# Patient Record
Sex: Male | Born: 1960 | Race: White | Hispanic: No | Marital: Married | Smoking: Never smoker
Health system: Southern US, Community
[De-identification: ages and names within clinical notes are randomized; demographics above are authoritative.]

## PROBLEM LIST (undated history)

## (undated) DIAGNOSIS — E785 Hyperlipidemia, unspecified: Secondary | ICD-10-CM

## (undated) DIAGNOSIS — I10 Essential (primary) hypertension: Secondary | ICD-10-CM

## (undated) DIAGNOSIS — C801 Malignant (primary) neoplasm, unspecified: Secondary | ICD-10-CM

## (undated) DIAGNOSIS — M1990 Unspecified osteoarthritis, unspecified site: Secondary | ICD-10-CM

## (undated) HISTORY — DX: Essential (primary) hypertension: I10

## (undated) HISTORY — DX: Hyperlipidemia, unspecified: E78.5

## (undated) HISTORY — PX: WISDOM TOOTH EXTRACTION: SHX21

## (undated) HISTORY — DX: Unspecified osteoarthritis, unspecified site: M19.90

## (undated) HISTORY — PX: NO PAST SURGERIES: SHX2092

---

## 1999-10-24 ENCOUNTER — Emergency Department (HOSPITAL_COMMUNITY): Admission: EM | Admit: 1999-10-24 | Discharge: 1999-10-24 | Payer: Self-pay | Admitting: Emergency Medicine

## 2000-04-18 ENCOUNTER — Emergency Department (HOSPITAL_COMMUNITY): Admission: EM | Admit: 2000-04-18 | Discharge: 2000-04-18 | Payer: Self-pay | Admitting: Emergency Medicine

## 2000-04-24 ENCOUNTER — Emergency Department (HOSPITAL_COMMUNITY): Admission: EM | Admit: 2000-04-24 | Discharge: 2000-04-24 | Payer: Self-pay | Admitting: Emergency Medicine

## 2002-07-20 ENCOUNTER — Emergency Department (HOSPITAL_COMMUNITY): Admission: EM | Admit: 2002-07-20 | Discharge: 2002-07-20 | Payer: Self-pay | Admitting: Emergency Medicine

## 2016-09-25 DIAGNOSIS — Z23 Encounter for immunization: Secondary | ICD-10-CM | POA: Diagnosis not present

## 2017-03-17 DIAGNOSIS — E7800 Pure hypercholesterolemia, unspecified: Secondary | ICD-10-CM | POA: Diagnosis not present

## 2017-03-17 DIAGNOSIS — I10 Essential (primary) hypertension: Secondary | ICD-10-CM | POA: Diagnosis not present

## 2017-03-17 DIAGNOSIS — D179 Benign lipomatous neoplasm, unspecified: Secondary | ICD-10-CM | POA: Diagnosis not present

## 2017-03-26 DIAGNOSIS — Z125 Encounter for screening for malignant neoplasm of prostate: Secondary | ICD-10-CM | POA: Diagnosis not present

## 2017-03-26 DIAGNOSIS — E7800 Pure hypercholesterolemia, unspecified: Secondary | ICD-10-CM | POA: Diagnosis not present

## 2017-03-26 DIAGNOSIS — I10 Essential (primary) hypertension: Secondary | ICD-10-CM | POA: Diagnosis not present

## 2017-05-14 DIAGNOSIS — M9903 Segmental and somatic dysfunction of lumbar region: Secondary | ICD-10-CM | POA: Diagnosis not present

## 2017-05-14 DIAGNOSIS — M25562 Pain in left knee: Secondary | ICD-10-CM | POA: Diagnosis not present

## 2017-05-15 DIAGNOSIS — M25562 Pain in left knee: Secondary | ICD-10-CM | POA: Diagnosis not present

## 2017-05-15 DIAGNOSIS — M9903 Segmental and somatic dysfunction of lumbar region: Secondary | ICD-10-CM | POA: Diagnosis not present

## 2017-05-19 DIAGNOSIS — M9903 Segmental and somatic dysfunction of lumbar region: Secondary | ICD-10-CM | POA: Diagnosis not present

## 2017-05-19 DIAGNOSIS — M25562 Pain in left knee: Secondary | ICD-10-CM | POA: Diagnosis not present

## 2017-05-20 DIAGNOSIS — M9903 Segmental and somatic dysfunction of lumbar region: Secondary | ICD-10-CM | POA: Diagnosis not present

## 2017-05-20 DIAGNOSIS — M25562 Pain in left knee: Secondary | ICD-10-CM | POA: Diagnosis not present

## 2017-05-22 DIAGNOSIS — M9903 Segmental and somatic dysfunction of lumbar region: Secondary | ICD-10-CM | POA: Diagnosis not present

## 2017-05-22 DIAGNOSIS — M25562 Pain in left knee: Secondary | ICD-10-CM | POA: Diagnosis not present

## 2017-05-26 DIAGNOSIS — M25562 Pain in left knee: Secondary | ICD-10-CM | POA: Diagnosis not present

## 2017-05-26 DIAGNOSIS — M9903 Segmental and somatic dysfunction of lumbar region: Secondary | ICD-10-CM | POA: Diagnosis not present

## 2017-05-28 DIAGNOSIS — M9903 Segmental and somatic dysfunction of lumbar region: Secondary | ICD-10-CM | POA: Diagnosis not present

## 2017-05-28 DIAGNOSIS — M25562 Pain in left knee: Secondary | ICD-10-CM | POA: Diagnosis not present

## 2017-05-29 DIAGNOSIS — M25562 Pain in left knee: Secondary | ICD-10-CM | POA: Diagnosis not present

## 2017-05-29 DIAGNOSIS — M9903 Segmental and somatic dysfunction of lumbar region: Secondary | ICD-10-CM | POA: Diagnosis not present

## 2017-06-05 DIAGNOSIS — M25562 Pain in left knee: Secondary | ICD-10-CM | POA: Diagnosis not present

## 2017-06-05 DIAGNOSIS — M9903 Segmental and somatic dysfunction of lumbar region: Secondary | ICD-10-CM | POA: Diagnosis not present

## 2017-06-11 DIAGNOSIS — M25562 Pain in left knee: Secondary | ICD-10-CM | POA: Diagnosis not present

## 2017-06-11 DIAGNOSIS — M9903 Segmental and somatic dysfunction of lumbar region: Secondary | ICD-10-CM | POA: Diagnosis not present

## 2017-06-23 DIAGNOSIS — M25562 Pain in left knee: Secondary | ICD-10-CM | POA: Diagnosis not present

## 2017-06-23 DIAGNOSIS — M9903 Segmental and somatic dysfunction of lumbar region: Secondary | ICD-10-CM | POA: Diagnosis not present

## 2017-06-26 DIAGNOSIS — M9903 Segmental and somatic dysfunction of lumbar region: Secondary | ICD-10-CM | POA: Diagnosis not present

## 2017-06-26 DIAGNOSIS — M25562 Pain in left knee: Secondary | ICD-10-CM | POA: Diagnosis not present

## 2017-06-30 DIAGNOSIS — M25562 Pain in left knee: Secondary | ICD-10-CM | POA: Diagnosis not present

## 2017-06-30 DIAGNOSIS — M9903 Segmental and somatic dysfunction of lumbar region: Secondary | ICD-10-CM | POA: Diagnosis not present

## 2017-07-10 DIAGNOSIS — M9903 Segmental and somatic dysfunction of lumbar region: Secondary | ICD-10-CM | POA: Diagnosis not present

## 2017-07-10 DIAGNOSIS — M25562 Pain in left knee: Secondary | ICD-10-CM | POA: Diagnosis not present

## 2017-07-29 DIAGNOSIS — M9903 Segmental and somatic dysfunction of lumbar region: Secondary | ICD-10-CM | POA: Diagnosis not present

## 2017-07-29 DIAGNOSIS — M25562 Pain in left knee: Secondary | ICD-10-CM | POA: Diagnosis not present

## 2017-08-25 DIAGNOSIS — M9903 Segmental and somatic dysfunction of lumbar region: Secondary | ICD-10-CM | POA: Diagnosis not present

## 2017-08-25 DIAGNOSIS — M25562 Pain in left knee: Secondary | ICD-10-CM | POA: Diagnosis not present

## 2017-09-16 DIAGNOSIS — M9903 Segmental and somatic dysfunction of lumbar region: Secondary | ICD-10-CM | POA: Diagnosis not present

## 2017-09-16 DIAGNOSIS — M25562 Pain in left knee: Secondary | ICD-10-CM | POA: Diagnosis not present

## 2017-11-03 DIAGNOSIS — M25562 Pain in left knee: Secondary | ICD-10-CM | POA: Diagnosis not present

## 2017-11-03 DIAGNOSIS — M9903 Segmental and somatic dysfunction of lumbar region: Secondary | ICD-10-CM | POA: Diagnosis not present

## 2017-11-27 DIAGNOSIS — M9903 Segmental and somatic dysfunction of lumbar region: Secondary | ICD-10-CM | POA: Diagnosis not present

## 2017-11-27 DIAGNOSIS — M25562 Pain in left knee: Secondary | ICD-10-CM | POA: Diagnosis not present

## 2017-12-18 DIAGNOSIS — M9903 Segmental and somatic dysfunction of lumbar region: Secondary | ICD-10-CM | POA: Diagnosis not present

## 2017-12-18 DIAGNOSIS — M25562 Pain in left knee: Secondary | ICD-10-CM | POA: Diagnosis not present

## 2018-01-13 DIAGNOSIS — M9903 Segmental and somatic dysfunction of lumbar region: Secondary | ICD-10-CM | POA: Diagnosis not present

## 2018-01-13 DIAGNOSIS — M25562 Pain in left knee: Secondary | ICD-10-CM | POA: Diagnosis not present

## 2018-02-09 DIAGNOSIS — M9903 Segmental and somatic dysfunction of lumbar region: Secondary | ICD-10-CM | POA: Diagnosis not present

## 2018-02-09 DIAGNOSIS — M25562 Pain in left knee: Secondary | ICD-10-CM | POA: Diagnosis not present

## 2018-03-09 DIAGNOSIS — M25562 Pain in left knee: Secondary | ICD-10-CM | POA: Diagnosis not present

## 2018-03-09 DIAGNOSIS — M9903 Segmental and somatic dysfunction of lumbar region: Secondary | ICD-10-CM | POA: Diagnosis not present

## 2018-03-11 DIAGNOSIS — Z23 Encounter for immunization: Secondary | ICD-10-CM | POA: Diagnosis not present

## 2018-04-06 DIAGNOSIS — M9903 Segmental and somatic dysfunction of lumbar region: Secondary | ICD-10-CM | POA: Diagnosis not present

## 2018-04-06 DIAGNOSIS — M25562 Pain in left knee: Secondary | ICD-10-CM | POA: Diagnosis not present

## 2018-04-16 DIAGNOSIS — Z23 Encounter for immunization: Secondary | ICD-10-CM | POA: Diagnosis not present

## 2018-04-16 DIAGNOSIS — Z0000 Encounter for general adult medical examination without abnormal findings: Secondary | ICD-10-CM | POA: Diagnosis not present

## 2018-04-16 DIAGNOSIS — Z13220 Encounter for screening for lipoid disorders: Secondary | ICD-10-CM | POA: Diagnosis not present

## 2018-04-30 DIAGNOSIS — M9903 Segmental and somatic dysfunction of lumbar region: Secondary | ICD-10-CM | POA: Diagnosis not present

## 2018-04-30 DIAGNOSIS — M25562 Pain in left knee: Secondary | ICD-10-CM | POA: Diagnosis not present

## 2018-06-01 DIAGNOSIS — M9903 Segmental and somatic dysfunction of lumbar region: Secondary | ICD-10-CM | POA: Diagnosis not present

## 2018-06-01 DIAGNOSIS — M25562 Pain in left knee: Secondary | ICD-10-CM | POA: Diagnosis not present

## 2018-06-29 DIAGNOSIS — F439 Reaction to severe stress, unspecified: Secondary | ICD-10-CM | POA: Diagnosis not present

## 2018-06-29 DIAGNOSIS — B354 Tinea corporis: Secondary | ICD-10-CM | POA: Diagnosis not present

## 2018-07-14 DIAGNOSIS — M9903 Segmental and somatic dysfunction of lumbar region: Secondary | ICD-10-CM | POA: Diagnosis not present

## 2018-07-14 DIAGNOSIS — M25562 Pain in left knee: Secondary | ICD-10-CM | POA: Diagnosis not present

## 2018-07-16 DIAGNOSIS — L821 Other seborrheic keratosis: Secondary | ICD-10-CM | POA: Diagnosis not present

## 2018-07-16 DIAGNOSIS — L738 Other specified follicular disorders: Secondary | ICD-10-CM | POA: Diagnosis not present

## 2018-07-16 DIAGNOSIS — L820 Inflamed seborrheic keratosis: Secondary | ICD-10-CM | POA: Diagnosis not present

## 2018-08-06 DIAGNOSIS — M25562 Pain in left knee: Secondary | ICD-10-CM | POA: Diagnosis not present

## 2018-08-06 DIAGNOSIS — M9903 Segmental and somatic dysfunction of lumbar region: Secondary | ICD-10-CM | POA: Diagnosis not present

## 2018-09-24 DIAGNOSIS — M9903 Segmental and somatic dysfunction of lumbar region: Secondary | ICD-10-CM | POA: Diagnosis not present

## 2018-09-24 DIAGNOSIS — M25562 Pain in left knee: Secondary | ICD-10-CM | POA: Diagnosis not present

## 2018-10-22 DIAGNOSIS — M9903 Segmental and somatic dysfunction of lumbar region: Secondary | ICD-10-CM | POA: Diagnosis not present

## 2018-10-22 DIAGNOSIS — M25562 Pain in left knee: Secondary | ICD-10-CM | POA: Diagnosis not present

## 2018-11-11 DIAGNOSIS — M9903 Segmental and somatic dysfunction of lumbar region: Secondary | ICD-10-CM | POA: Diagnosis not present

## 2018-11-11 DIAGNOSIS — M25562 Pain in left knee: Secondary | ICD-10-CM | POA: Diagnosis not present

## 2018-12-07 DIAGNOSIS — M9903 Segmental and somatic dysfunction of lumbar region: Secondary | ICD-10-CM | POA: Diagnosis not present

## 2018-12-07 DIAGNOSIS — M25562 Pain in left knee: Secondary | ICD-10-CM | POA: Diagnosis not present

## 2019-01-06 DIAGNOSIS — M25562 Pain in left knee: Secondary | ICD-10-CM | POA: Diagnosis not present

## 2019-01-06 DIAGNOSIS — M9903 Segmental and somatic dysfunction of lumbar region: Secondary | ICD-10-CM | POA: Diagnosis not present

## 2019-01-21 DIAGNOSIS — J029 Acute pharyngitis, unspecified: Secondary | ICD-10-CM | POA: Diagnosis not present

## 2019-02-09 DIAGNOSIS — M9903 Segmental and somatic dysfunction of lumbar region: Secondary | ICD-10-CM | POA: Diagnosis not present

## 2019-02-09 DIAGNOSIS — M25562 Pain in left knee: Secondary | ICD-10-CM | POA: Diagnosis not present

## 2021-07-02 ENCOUNTER — Other Ambulatory Visit: Payer: Self-pay | Admitting: Family Medicine

## 2021-07-02 DIAGNOSIS — E7800 Pure hypercholesterolemia, unspecified: Secondary | ICD-10-CM

## 2021-08-03 ENCOUNTER — Other Ambulatory Visit: Payer: Self-pay

## 2021-08-21 ENCOUNTER — Other Ambulatory Visit: Payer: Self-pay

## 2021-09-11 ENCOUNTER — Ambulatory Visit
Admission: RE | Admit: 2021-09-11 | Discharge: 2021-09-11 | Disposition: A | Discharge status: Home / Self Care | Payer: No Typology Code available for payment source | Source: Ambulatory Visit | Attending: Family Medicine | Admitting: Family Medicine

## 2021-09-11 DIAGNOSIS — E7800 Pure hypercholesterolemia, unspecified: Secondary | ICD-10-CM

## 2021-09-23 NOTE — Progress Notes (Signed)
Cardiology Office Note   Date:  09/25/2021   ID:  Kenneth Boone, DOB May 31, 1961, MRN [id number]  PCP:  Lawerance Cruel, MD  Cardiologist:   Morena Mckissack Martinique, MD   Chief Complaint  Patient presents with   Coronary Artery Disease      History of Present Illness: Kenneth Boone is a 60 y.o. male who is seen at the request of Dr Harrington Challenger for CAD with abnormal coronary calcium score. He has a history of HTN and HLD. States he has been on statin for 25 years. When seen by Dr Harrington Challenger coronary calcium score ordered to assess cardiac risk. Found to have severe 3 vessel calcification with score of 1212 placing him at [place] percentile. He denies any chest pain, SOB, palpitations. Notes some fatigue. He does ride his bike 6-10 miles. Needs knee replacement surgery.    Past Medical History:  Diagnosis Date   Hyperlipidemia    Hypertension    Osteoarthritis     History reviewed. No pertinent surgical history.   Current Outpatient Medications  Medication Sig Dispense Refill   Folic Acid-Cholecalciferol 12-1998 MG-UNIT TABS Take by mouth.     losartan (COZAAR) 100 MG tablet Take 100 mg by mouth daily.     rosuvastatin (CRESTOR) 40 MG tablet Take 40 mg by mouth daily.     No current facility-administered medications for this visit.    Allergies:   Patient has no allergy information on record.    Social History:  The patient  reports that he has never smoked. He has never been exposed to tobacco smoke. He has never used smokeless tobacco.   Family History:  The patient's family history includes Diabetes Mellitus II in his brother, mother, and sister; Heart attack (age of onset: 52) in his father; Stroke in his father.    ROS:  Please see the history of present illness.   Otherwise, review of systems are positive for none.   All other systems are reviewed and negative.    PHYSICAL EXAM: VS:  BP (!) 160/90 (BP Location: Left Arm)   Pulse 94   Ht 6' (1.829 m)   Wt 217 lb 3.2 oz  (98.5 kg)   SpO2 96%   BMI 29.46 kg/m  , BMI Body mass index is 29.46 kg/m. GEN: Well nourished, well developed, in no acute distress HEENT: normal Neck: no JVD, carotid bruits, or masses Cardiac: RRR; no murmurs, rubs, or gallops,no edema  Respiratory:  clear to auscultation bilaterally, normal work of breathing GI: soft, nontender, nondistended, + BS MS: no deformity or atrophy Skin: warm and dry, no rash Neuro:  Strength and sensation are intact Psych: euthymic mood, full affect   EKG:  EKG is ordered today. The ekg ordered today demonstrates NSR rate 94. Normal. I have personally reviewed and interpreted this study.    Recent Labs: No results found for requested labs within last 8760 hours.   Dated 06/13/21: cholesterol 262, triglycerides 366. HDL 41, LDL 153. CMET and PSA normal.  Lipid Panel No results found for: CHOL, TRIG, HDL, CHOLHDL, VLDL, LDLCALC, LDLDIRECT    Wt Readings from Last 3 Encounters:  09/25/21 217 lb 3.2 oz (98.5 kg)      Other studies Reviewed: Additional studies/ records that were reviewed today include:   CLINICAL DATA:  60 year old Caucasian male with history of hyperlipidemia, hypertension and family history of heart disease.   EXAM: CT CARDIAC CORONARY ARTERY CALCIUM SCORE   TECHNIQUE: Non-contrast imaging through the  heart was performed using prospective ECG gating. Image post processing was performed on an independent workstation, allowing for quantitative analysis of the heart and coronary arteries. Note that this exam targets the heart and the chest was not imaged in its entirety.   COMPARISON:  None.   FINDINGS: CORONARY CALCIUM SCORES:   Left Main: 0   LAD: 959   LCx: 7.5   RCA: 245   Total Agatston Score: 1,212   MESA database percentile: 98   AORTA MEASUREMENTS:   Ascending Aorta: 34 mm   Descending Aorta: 24 mm   OTHER FINDINGS:   The heart size is within normal limits. No pericardial fluid  is identified. Visualized segments of the thoracic aorta and central pulmonary arteries are normal in caliber. Visualized mediastinum and hilar regions demonstrate no lymphadenopathy or masses. Visualized lungs show no evidence of pulmonary edema, consolidation, pneumothorax, nodule or pleural fluid. Visualized upper abdomen and bony structures are unremarkable.   IMPRESSION: Coronary calcium score of 1,212 is at the [place] percentile for the patient's age, sex and race.     Electronically Signed   By: Aletta Edouard M.D.   On: 09/11/2021 11:29    ASSESSMENT AND PLAN:  1.  Coronary artery disease with very high calcium score indicating higher CV risk. No typical anginal symptoms but does have some fatigue. I have recommended a stress myoview to further assess degree of risk. Needs aggressive risk factor modification. I would recommend he take ASA 81 mg daily. If myoview is low risk will focus on risk factor modification. He is to let us know if he develops any new chest pain. Stressed importance of lifestyle modification with more plant based diet with healthy oils and regular aerobic exercise.  2. HLD. LDL still elevated despite moderate dose atorvastatin. Goal LDL < 70. Switching to Crestor 40 mg daily now. Needs repeat lab in 2-3 months. If still not at goal may need to consider PCSK 9 inhibitor.  3. HTN. Reports BP usually good just anxious today. On losartan. Asked him to keep BP diary to review. Needs to reduce concentrated sweet intake. If triglycerides still elevated on follow up consider Vascepa.    Current medicines are reviewed at length with the patient today.  The patient does not have concerns regarding medicines.  The following changes have been made:  Crestor 40 mg daily  Labs/ tests ordered today include:   Orders Placed This Encounter  Procedures   Cardiac Stress Test: Informed Consent Details: Physician/Practitioner Attestation; Transcribe to consent form and  obtain patient signature   MYOCARDIAL PERFUSION IMAGING   EKG 12-Lead     Disposition:   FU with me pending results of above.l  Signed, Barbera Perritt Martinique, MD  09/25/2021 3:28 PM    [facility] [address], [place], [place], [address] Phone [phone], Fax [phone]

## 2021-09-25 ENCOUNTER — Encounter: Payer: Self-pay | Admitting: Cardiology

## 2021-09-25 ENCOUNTER — Other Ambulatory Visit: Payer: Self-pay

## 2021-09-25 ENCOUNTER — Ambulatory Visit: Payer: Commercial Managed Care - PPO | Admitting: Cardiology

## 2021-09-25 VITALS — BP 160/90 | HR 94 | Ht 72.0 in | Wt 217.2 lb

## 2021-09-25 DIAGNOSIS — I2510 Atherosclerotic heart disease of native coronary artery without angina pectoris: Secondary | ICD-10-CM

## 2021-09-25 DIAGNOSIS — E782 Mixed hyperlipidemia: Secondary | ICD-10-CM | POA: Diagnosis not present

## 2021-09-25 DIAGNOSIS — I10 Essential (primary) hypertension: Secondary | ICD-10-CM

## 2021-09-25 DIAGNOSIS — I2584 Coronary atherosclerosis due to calcified coronary lesion: Secondary | ICD-10-CM | POA: Diagnosis not present

## 2021-09-25 NOTE — Patient Instructions (Addendum)
We will arrange for you to have a nuclear stress (stress my view)  Go ahead and start Crestor 40 mg daily  Take ASA 81 mg daily  Monitor BP at home and keep a record  Follow up appointment to be determined stress test

## 2021-09-26 ENCOUNTER — Telehealth: Payer: Self-pay | Admitting: Cardiology

## 2021-09-26 ENCOUNTER — Telehealth (HOSPITAL_COMMUNITY): Payer: Self-pay | Admitting: *Deleted

## 2021-09-26 NOTE — Telephone Encounter (Signed)
Close encounter 

## 2021-09-26 NOTE — Telephone Encounter (Signed)
Spoke to patient he wanted to know if ok to take B/P medication before his stress test tomorrow 10/13.Advised to take Losartan 100 mg with water before stress test.

## 2021-09-26 NOTE — Telephone Encounter (Signed)
Pt is needing medication specific instructions for procedure tomorrow... please advise

## 2021-09-26 NOTE — Telephone Encounter (Signed)
Routed to primary nurse and nuclear medicine team for assistance

## 2021-09-27 ENCOUNTER — Ambulatory Visit (HOSPITAL_COMMUNITY)
Admission: RE | Admit: 2021-09-27 | Discharge: 2021-09-27 | Disposition: A | Discharge status: Home / Self Care | Payer: Commercial Managed Care - PPO | Source: Ambulatory Visit | Attending: Cardiology | Admitting: Cardiology

## 2021-09-27 ENCOUNTER — Other Ambulatory Visit: Payer: Self-pay

## 2021-09-27 DIAGNOSIS — I2510 Atherosclerotic heart disease of native coronary artery without angina pectoris: Secondary | ICD-10-CM | POA: Diagnosis not present

## 2021-09-27 DIAGNOSIS — I2584 Coronary atherosclerosis due to calcified coronary lesion: Secondary | ICD-10-CM | POA: Insufficient documentation

## 2021-09-27 DIAGNOSIS — I10 Essential (primary) hypertension: Secondary | ICD-10-CM | POA: Diagnosis not present

## 2021-09-27 DIAGNOSIS — E782 Mixed hyperlipidemia: Secondary | ICD-10-CM | POA: Diagnosis not present

## 2021-09-27 LAB — MYOCARDIAL PERFUSION IMAGING
Angina Index: 0
Base ST Depression (mm): 0 mm
Duke Treadmill Score: 8
Estimated workload: 9
Exercise duration (min): 8 min
Exercise duration (sec): 11 s
LV dias vol: 67 mL (ref 62–150)
LV sys vol: 21 mL
MPHR: 161 {beats}/min
Nuc Stress EF: 69 %
Peak HR: 148 {beats}/min
Percent HR: 91 %
Rest HR: 88 {beats}/min
Rest Nuclear Isotope Dose: 10.6 mCi
SDS: 1
SRS: 0
SSS: 1
ST Depression (mm): 0 mm
Stress Nuclear Isotope Dose: 31 mCi
TID: 0.89

## 2021-09-27 MED ORDER — TECHNETIUM TC 99M TETROFOSMIN IV KIT
31.0000 | PACK | Freq: Once | INTRAVENOUS | Status: AC | PRN
  Administered 2021-09-27: 31 via INTRAVENOUS
  Filled 2021-09-27: qty 31

## 2021-09-27 MED ORDER — TECHNETIUM TC 99M TETROFOSMIN IV KIT
10.6000 | PACK | Freq: Once | INTRAVENOUS | Status: AC | PRN
  Administered 2021-09-27: 10.6 via INTRAVENOUS
  Filled 2021-09-27: qty 11

## 2021-09-28 ENCOUNTER — Telehealth: Payer: Self-pay | Admitting: Cardiology

## 2021-09-28 NOTE — Telephone Encounter (Signed)
Kenneth Boone is calling requesting his ETT results.

## 2021-09-28 NOTE — Telephone Encounter (Signed)
Spoke with patient regarding the following results. Patient made aware and patient verbalized understanding.   Kenneth M Martinique, MD  09/27/2021  2:52 PM EDT     This study demonstrates:  Myoview is normal Medication changes / Follow up studies / Other recommendations:   This is a low risk study. So although calcium score is high he does not appear to have significant obstruction in the vessels. This means we need to aggressively treat risk factors like cholesterol with goal LDL < 70. Dr Harrington Challenger can manage this. I would like to follow up in one year.    Please send results to the PCP:  Lawerance Cruel, MD  Kenneth Martinique, MD 09/27/2021 2:50 PM    Recall for one year entered.

## 2021-12-18 DIAGNOSIS — M9903 Segmental and somatic dysfunction of lumbar region: Secondary | ICD-10-CM | POA: Diagnosis not present

## 2021-12-20 DIAGNOSIS — M9903 Segmental and somatic dysfunction of lumbar region: Secondary | ICD-10-CM | POA: Diagnosis not present

## 2021-12-24 DIAGNOSIS — M9903 Segmental and somatic dysfunction of lumbar region: Secondary | ICD-10-CM | POA: Diagnosis not present

## 2021-12-27 DIAGNOSIS — M9903 Segmental and somatic dysfunction of lumbar region: Secondary | ICD-10-CM | POA: Diagnosis not present

## 2021-12-31 DIAGNOSIS — M9903 Segmental and somatic dysfunction of lumbar region: Secondary | ICD-10-CM | POA: Diagnosis not present

## 2022-01-03 DIAGNOSIS — M9903 Segmental and somatic dysfunction of lumbar region: Secondary | ICD-10-CM | POA: Diagnosis not present

## 2022-01-07 DIAGNOSIS — M9903 Segmental and somatic dysfunction of lumbar region: Secondary | ICD-10-CM | POA: Diagnosis not present

## 2022-01-14 DIAGNOSIS — M9903 Segmental and somatic dysfunction of lumbar region: Secondary | ICD-10-CM | POA: Diagnosis not present

## 2022-02-06 DIAGNOSIS — M9903 Segmental and somatic dysfunction of lumbar region: Secondary | ICD-10-CM | POA: Diagnosis not present

## 2022-02-28 DIAGNOSIS — M9903 Segmental and somatic dysfunction of lumbar region: Secondary | ICD-10-CM | POA: Diagnosis not present

## 2022-03-21 DIAGNOSIS — M9903 Segmental and somatic dysfunction of lumbar region: Secondary | ICD-10-CM | POA: Diagnosis not present

## 2022-04-22 ENCOUNTER — Telehealth: Payer: Self-pay | Admitting: Cardiology

## 2022-04-22 NOTE — Telephone Encounter (Signed)
Follow Up: ? ? ? ? ?Patient is checking on the status of his clearance. ?

## 2022-04-24 DIAGNOSIS — M9903 Segmental and somatic dysfunction of lumbar region: Secondary | ICD-10-CM | POA: Diagnosis not present

## 2022-04-24 NOTE — Telephone Encounter (Signed)
I do not see a cardiac clearance on this patient, please obtain more information ?

## 2022-04-24 NOTE — Telephone Encounter (Signed)
Left message for the pt that we do not have a clearance request. Please have the surgeon office fax over clearance request to fax# [phone] attn: pre op team. Once we have clearance will have pre op team review. I will remove from the pre op call back at this time.  ?

## 2022-04-25 ENCOUNTER — Telehealth: Payer: Self-pay | Admitting: *Deleted

## 2022-04-25 NOTE — Telephone Encounter (Signed)
Tele pre op appt 04/29/22 @ 4 pm. Med rec and consent are done.  ?

## 2022-04-25 NOTE — Telephone Encounter (Signed)
Tele pre op appt 04/29/22 @ 4 pm. Med rec and consent are done.  ? ?  ?Patient Consent for Virtual Visit  ? ? ?   ? ?Kenneth Boone has provided verbal consent on 04/25/2022 for a virtual visit (video or telephone). ? ? ?CONSENT FOR VIRTUAL VISIT FOR:  Kenneth Boone  ?By participating in this virtual visit I agree to the following: ? ?I hereby voluntarily request, consent and authorize [facility] and its employed or contracted physicians, physician assistants, nurse practitioners or other licensed health care professionals (the Practitioner), to provide me with telemedicine health care services (the ?Services") as deemed necessary by the treating Practitioner. I acknowledge and consent to receive the Services by the Practitioner via telemedicine. I understand that the telemedicine visit will involve communicating with the Practitioner through live audiovisual communication technology and the disclosure of certain medical information by electronic transmission. I acknowledge that I have been given the opportunity to request an in-person assessment or other available alternative prior to the telemedicine visit and am voluntarily participating in the telemedicine visit. ? ?I understand that I have the right to withhold or withdraw my consent to the use of telemedicine in the course of my care at any time, without affecting my right to future care or treatment, and that the Practitioner or I may terminate the telemedicine visit at any time. I understand that I have the right to inspect all information obtained and/or recorded in the course of the telemedicine visit and may receive copies of available information for a reasonable fee.  I understand that some of the potential risks of receiving the Services via telemedicine include:  ?Delay or interruption in medical evaluation due to technological equipment failure or disruption; ?Information transmitted may not be sufficient (e.g. poor resolution of images)  to allow for appropriate medical decision making by the Practitioner; and/or  ?In rare instances, security protocols could fail, causing a breach of personal health information. ? ?Furthermore, I acknowledge that it is my responsibility to provide information about my medical history, conditions and care that is complete and accurate to the best of my ability. I acknowledge that Practitioner's advice, recommendations, and/or decision may be based on factors not within their control, such as incomplete or inaccurate data provided by me or distortions of diagnostic images or specimens that may result from electronic transmissions. I understand that the practice of medicine is not an exact science and that Practitioner makes no warranties or guarantees regarding treatment outcomes. I acknowledge that a copy of this consent can be made available to me via my patient portal ([facility]), or I can request a printed copy by calling the office of [facility].   ? ?I understand that my insurance will be billed for this visit.  ? ?I have read or had this consent read to me. ?I understand the contents of this consent, which adequately explains the benefits and risks of the Services being provided via telemedicine.  ?I have been provided ample opportunity to ask questions regarding this consent and the Services and have had my questions answered to my satisfaction. ?I give my informed consent for the services to be provided through the use of telemedicine in my medical care ? ? ? ?

## 2022-04-25 NOTE — Telephone Encounter (Signed)
Preoperative team, please contact this patient and set up a phone call appointment for further cardiac evaluation.  Thank you for your help. ? ?Jossie Ng. Tanashia Ciesla NP-C ? ?  ?04/25/2022, 2:12 PM ?[facility] ?[address] ?Office [phone] Fax [phone] ? ?

## 2022-04-25 NOTE — Telephone Encounter (Signed)
? ?  Pre-operative Risk Assessment  ?  ?Patient Name: Kenneth Boone  ?DOB: Sep 14, 1961 ?MRN: [id number]  ? ?  ? ?Request for Surgical Clearance   ? ?Procedure:   left total knee arthoplasty ? ?Date of Surgery:  Clearance TBD                              ?   ?Surgeon:  Dr. Rod Can ?Surgeon's Group or Practice Name:  [facility] ?Phone number:  [phone] ?Fax number:  [phone] (attn: [facility]) ?  ?Type of Clearance Requested:   ?- Medical  ?  ?Type of Anesthesia:  Spinal ?  ?Additional requests/questions:   n/a ? ?Signed, ?Sheral Apley M   ?04/25/2022, 1:39 PM  ? ?

## 2022-04-29 ENCOUNTER — Ambulatory Visit (INDEPENDENT_AMBULATORY_CARE_PROVIDER_SITE_OTHER): Payer: Self-pay | Admitting: Nurse Practitioner

## 2022-04-29 ENCOUNTER — Encounter: Payer: Self-pay | Admitting: Nurse Practitioner

## 2022-04-29 DIAGNOSIS — Z01810 Encounter for preprocedural cardiovascular examination: Secondary | ICD-10-CM

## 2022-04-29 NOTE — Progress Notes (Signed)
? ?Virtual Visit via Telephone Note  ? ?This visit type was conducted due to national recommendations for restrictions regarding the COVID-19 Pandemic (e.g. social distancing) in an effort to limit this patient's exposure and mitigate transmission in our community.  Due to his co-morbid illnesses, this patient is at least at moderate risk for complications without adequate follow up.  This format is felt to be most appropriate for this patient at this time.  The patient did not have access to video technology/had technical difficulties with video requiring transitioning to audio format only (telephone).  All issues noted in this document were discussed and addressed.  No physical exam could be performed with this format.  Please refer to the patient's chart for his  consent to telehealth for [facility]. ? ?Evaluation Performed:  Preoperative cardiovascular risk assessment ?_____________  ? ?Date:  04/29/2022  ? ?Patient ID:  Kenneth Boone, Kenneth Boone 1961/09/05, MRN [id number] ?Patient Location:  ?Home ?Provider location:   ?Office ? ?Primary Care Provider:  Lawerance Cruel, MD ?Primary Cardiologist:  Peter Martinique, MD ? ?Chief Complaint  ?  ?61 y.o. y/o male with a h/o CAD (elevated calcium score of 1212, 98 percentile, follow-up Lexiscan Myoview in October 2022 was normal), hypertension, hyperlipidemia, and osteoarthritis who is pending left total knee arthroplasty, date TBD, with Dr. Rod Can of [facility], and presents today for telephonic preoperative cardiovascular risk assessment. ? ?Past Medical History  ?  ?Past Medical History:  ?Diagnosis Date  ? Hyperlipidemia   ? Hypertension   ? Osteoarthritis   ? ?No past surgical history on file. ? ?Allergies ? ?Not on File ? ?History of Present Illness  ?  ?Kenneth Boone is a 61 y.o. male who presents via audio/video conferencing for a telehealth visit today.  Pt was last seen in cardiology clinic on 09/25/2021 by Dr. Martinique.  At that time LEGEND TUMMINELLO was doing well from a cardiac standpoint. He denied symptoms concerning for angina at the time. Lifestyle modifications with diet and exercise was recommended. Additionally, he was transitioned to Crestor.  He did undergo Lexiscan Myoview in the setting of elevated calcium score which was normal. The patient is now pending procedure as outlined above. Since his last visit, he has been stable from a cardiac standpoint. He denies chest pain, palpitations, dyspnea, pnd, orthopnea, n, v, dizziness, syncope, edema, weight gain, or early satiety.  Overall, he reports feeling well denies any new concerns today. ? ?Home Medications  ?  ?Prior to Admission medications   ?Medication Sig Start Date End Date Taking? Authorizing Provider  ?aspirin EC 81 MG tablet Take 81 mg by mouth daily. Swallow whole.    [provider]  ?Folic Acid-Cholecalciferol 12-1998 MG-UNIT TABS Take by mouth. ?Patient not taking: Reported on 04/25/2022    [provider]  ?losartan (COZAAR) 100 MG tablet Take 100 mg by mouth daily. 07/30/21   [provider]  ?rosuvastatin (CRESTOR) 40 MG tablet Take 40 mg by mouth daily. 06/29/21   [provider]  ?vitamin C (ASCORBIC ACID) 500 MG tablet Take 1,000 mg by mouth daily.    [provider]  ? ? ?Physical Exam  ?  ?Vital Signs:  BIRDIE BEVERIDGE does not have vital signs available for review today. ? ?Given telephonic nature of communication, physical exam is limited. ?AAOx3. NAD. Normal affect.  Speech and respirations are unlabored. ? ?Accessory Clinical Findings  ?  ?None ? ?Assessment & Plan  ?  ?1.  Preoperative Cardiovascular Risk Assessment: ? ?According to the Revised Cardiac Risk Index (RCRI), his Perioperative Risk of Major Cardiac Event is (%): 0.4. His Functional Capacity in METs is: 8.97 according to the Duke Activity Status Index (DASI). Therefore, based on ACC/AHA guidelines, patient would be at acceptable risk for the planned procedure  without further cardiovascular testing. ? ?Patient takes aspirin 81 mg daily. Ideally, patient will continue aspirin 81 mg daily through the perioperative period.  However, if aspirin needs to be held prior to surgery, please contact our office. ? ?A copy of this note will be routed to requesting surgeon. ? ?Time:   ?Today, I have spent 6 minutes with the patient with telehealth technology discussing medical history, symptoms, and management plan.   ? ? ?Lenna Sciara, NP ? ?04/29/2022, 4:14 PM ? ?

## 2022-05-03 ENCOUNTER — Ambulatory Visit: Payer: Self-pay | Admitting: Student

## 2022-05-10 NOTE — Patient Instructions (Signed)
DUE TO COVID-19 ONLY TWO VISITORS  (aged 61 and older)  ARE ALLOWED TO COME WITH YOU AND STAY IN THE WAITING ROOM ONLY DURING PRE OP AND PROCEDURE.   **NO VISITORS ARE ALLOWED IN THE SHORT STAY AREA OR RECOVERY ROOM!!**  IF YOU WILL BE ADMITTED INTO THE HOSPITAL YOU ARE ALLOWED ONLY FOUR SUPPORT PEOPLE DURING VISITATION HOURS ONLY (7 AM -8PM)   The support person(s) must pass our screening, gel in and out, and wear a mask at all times, including in the patient's room. Patients must also wear a mask when staff or their support person are in the room. Visitors GUEST BADGE MUST BE WORN VISIBLY  One adult visitor may remain with you overnight and MUST be in the room by 8 P.M.     Your procedure is scheduled on: 05/22/22   Report to [facility] Main Entrance    Report to admitting at: 9:15 AM   Call this number if you have problems the morning of surgery [phone]   Do not eat food :After Midnight.   After Midnight you may have the following liquids until: 9:00 AM DAY OF SURGERY  Water Black Coffee (sugar ok, NO MILK/CREAM OR CREAMERS)  Tea (sugar ok, NO MILK/CREAM OR CREAMERS) regular and decaf                             Plain Jell-O (NO RED)                                           Fruit ices (not with fruit pulp, NO RED)                                     Popsicles (NO RED)                                                                  Juice: apple, WHITE grape, WHITE cranberry Sports drinks like Gatorade (NO RED) Clear broth(vegetable,chicken,beef)  Drink  Ensure drink AT : 9:00 AM the day of surgery.     The day of surgery:  Drink ONE (1) Pre-Surgery Clear Ensure or G2 at AM the morning of surgery. Drink in one sitting. Do not sip.  This drink was given to you during your hospital  pre-op appointment visit. Nothing else to drink after completing the  Pre-Surgery Clear Ensure or G2.          If you have questions, please contact your surgeon's office.      Oral Hygiene is also important to reduce your risk of infection.                                    Remember - BRUSH YOUR TEETH THE MORNING OF SURGERY WITH YOUR REGULAR TOOTHPASTE   Do NOT smoke after Midnight   Take these medicines the morning of surgery with A SIP OF WATER: N/A  DO NOT TAKE ANY ORAL  DIABETIC MEDICATIONS DAY OF YOUR SURGERY  Bring CPAP mask and tubing day of surgery.                              You may not have any metal on your body including hair pins, jewelry, and body piercing             Do not wear  lotions, powders, perfumes/cologne, or deodorant              Men may shave face and neck.   Do not bring valuables to the hospital. Painter.   Contacts, dentures or bridgework may not be worn into surgery.   Bring small overnight bag day of surgery.    Patients discharged on the day of surgery will not be allowed to drive home.  Someone NEEDS to stay with you for the first 24 hours after anesthesia.   Special Instructions: Bring a copy of your healthcare power of attorney and living will documents         the day of surgery if you haven't scanned them before.              Please read over the following fact sheets you were given: IF YOU HAVE QUESTIONS ABOUT YOUR PRE-OP INSTRUCTIONS PLEASE CALL [phone]     [facility] - Preparing for Surgery Before surgery, you can play an important role.  Because skin is not sterile, your skin needs to be as free of germs as possible.  You can reduce the number of germs on your skin by washing with CHG (chlorahexidine gluconate) soap before surgery.  CHG is an antiseptic cleaner which kills germs and bonds with the skin to continue killing germs even after washing. Please DO NOT use if you have an allergy to CHG or antibacterial soaps.  If your skin becomes reddened/irritated stop using the CHG and inform your nurse when you arrive at Short Stay. Do not shave (including  legs and underarms) for at least 48 hours prior to the first CHG shower.  You may shave your face/neck. Please follow these instructions carefully:  1.  Shower with CHG Soap the night before surgery and the  morning of Surgery.  2.  If you choose to wash your hair, wash your hair first as usual with your  normal  shampoo.  3.  After you shampoo, rinse your hair and body thoroughly to remove the  shampoo.                           4.  Use CHG as you would any other liquid soap.  You can apply chg directly  to the skin and wash                       Gently with a scrungie or clean washcloth.  5.  Apply the CHG Soap to your body ONLY FROM THE NECK DOWN.   Do not use on face/ open                           Wound or open sores. Avoid contact with eyes, ears mouth and genitals (private parts).  Wash face,  Genitals (private parts) with your normal soap.             6.  Wash thoroughly, paying special attention to the area where your surgery  will be performed.  7.  Thoroughly rinse your body with warm water from the neck down.  8.  DO NOT shower/wash with your normal soap after using and rinsing off  the CHG Soap.                9.  Pat yourself dry with a clean towel.            10.  Wear clean pajamas.            11.  Place clean sheets on your bed the night of your first shower and do not  sleep with pets. Day of Surgery : Do not apply any lotions/deodorants the morning of surgery.  Please wear clean clothes to the hospital/surgery center.  FAILURE TO FOLLOW THESE INSTRUCTIONS MAY RESULT IN THE CANCELLATION OF YOUR SURGERY PATIENT SIGNATURE_________________________________  NURSE SIGNATURE__________________________________  ________________________________________________________________________   Kenneth Boone  An incentive spirometer is a tool that can help keep your lungs clear and active. This tool measures how well you are filling your lungs with each breath.  Taking long deep breaths may help reverse or decrease the chance of developing breathing (pulmonary) problems (especially infection) following: A long period of time when you are unable to move or be active. BEFORE THE PROCEDURE  If the spirometer includes an indicator to show your best effort, your nurse or respiratory therapist will set it to a desired goal. If possible, sit up straight or lean slightly forward. Try not to slouch. Hold the incentive spirometer in an upright position. INSTRUCTIONS FOR USE  Sit on the edge of your bed if possible, or sit up as far as you can in bed or on a chair. Hold the incentive spirometer in an upright position. Breathe out normally. Place the mouthpiece in your mouth and seal your lips tightly around it. Breathe in slowly and as deeply as possible, raising the piston or the ball toward the top of the column. Hold your breath for 3-5 seconds or for as long as possible. Allow the piston or ball to fall to the bottom of the column. Remove the mouthpiece from your mouth and breathe out normally. Rest for a few seconds and repeat Steps 1 through 7 at least 10 times every 1-2 hours when you are awake. Take your time and take a few normal breaths between deep breaths. The spirometer may include an indicator to show your best effort. Use the indicator as a goal to work toward during each repetition. After each set of 10 deep breaths, practice coughing to be sure your lungs are clear. If you have an incision (the cut made at the time of surgery), support your incision when coughing by placing a pillow or rolled up towels firmly against it. Once you are able to get out of bed, walk around indoors and cough well. You may stop using the incentive spirometer when instructed by your caregiver.  RISKS AND COMPLICATIONS Take your time so you do not get dizzy or light-headed. If you are in pain, you may need to take or ask for pain medication before doing incentive  spirometry. It is harder to take a deep breath if you are having pain. AFTER USE Rest and breathe slowly and easily. It can be helpful to keep track  of a log of your progress. Your caregiver can provide you with a simple table to help with this. If you are using the spirometer at home, follow these instructions: [place] IF:  You are having difficultly using the spirometer. You have trouble using the spirometer as often as instructed. Your pain medication is not giving enough relief while using the spirometer. You develop fever of 100.5 F (38.1 C) or higher. SEEK IMMEDIATE MEDICAL CARE IF:  You cough up bloody sputum that had not been present before. You develop fever of 102 F (38.9 C) or greater. You develop worsening pain at or near the incision site. MAKE SURE YOU:  Understand these instructions. Will watch your condition. Will get help right away if you are not doing well or get worse. Document Released: 04/14/2007 Document Revised: 02/24/2012 Document Reviewed: 06/15/2007 [facility] Patient Information 2014 [place], [place].   ________________________________________________________________________

## 2022-05-14 ENCOUNTER — Encounter (HOSPITAL_COMMUNITY)
Admission: RE | Admit: 2022-05-14 | Discharge: 2022-05-14 | Disposition: A | Discharge status: Home / Self Care | Payer: No Typology Code available for payment source | Source: Ambulatory Visit | Attending: Orthopedic Surgery | Admitting: Orthopedic Surgery

## 2022-05-14 ENCOUNTER — Other Ambulatory Visit: Payer: Self-pay

## 2022-05-14 ENCOUNTER — Ambulatory Visit: Payer: Self-pay | Admitting: Student

## 2022-05-14 ENCOUNTER — Encounter (HOSPITAL_COMMUNITY): Payer: Self-pay

## 2022-05-14 VITALS — BP 153/88 | HR 93 | Temp 97.7°F | Ht 72.0 in | Wt 210.0 lb

## 2022-05-14 DIAGNOSIS — Z01812 Encounter for preprocedural laboratory examination: Secondary | ICD-10-CM | POA: Diagnosis present

## 2022-05-14 DIAGNOSIS — Z01818 Encounter for other preprocedural examination: Secondary | ICD-10-CM

## 2022-05-14 DIAGNOSIS — I2510 Atherosclerotic heart disease of native coronary artery without angina pectoris: Secondary | ICD-10-CM | POA: Diagnosis not present

## 2022-05-14 HISTORY — DX: Malignant (primary) neoplasm, unspecified: C80.1

## 2022-05-14 LAB — CBC
HCT: 44.6 % (ref 39.0–52.0)
Hemoglobin: 15.5 g/dL (ref 13.0–17.0)
MCH: 32.2 pg (ref 26.0–34.0)
MCHC: 34.8 g/dL (ref 30.0–36.0)
MCV: 92.7 fL (ref 80.0–100.0)
Platelets: 265 10*3/uL (ref 150–400)
RBC: 4.81 MIL/uL (ref 4.22–5.81)
RDW: 12.5 % (ref 11.5–15.5)
WBC: 8.7 10*3/uL (ref 4.0–10.5)
nRBC: 0 % (ref 0.0–0.2)

## 2022-05-14 LAB — BASIC METABOLIC PANEL
Anion gap: 9 (ref 5–15)
BUN: 15 mg/dL (ref 6–20)
CO2: 23 mmol/L (ref 22–32)
Calcium: 9.4 mg/dL (ref 8.9–10.3)
Chloride: 106 mmol/L (ref 98–111)
Creatinine, Ser: 0.84 mg/dL (ref 0.61–1.24)
GFR, Estimated: >60 mL/min (ref >60)
Glucose, Bld: 92 mg/dL (ref 70–99)
Potassium: 4.1 mmol/L (ref 3.5–5.1)
Sodium: 138 mmol/L (ref 135–145)

## 2022-05-14 LAB — SURGICAL PCR SCREEN
MRSA, PCR: NEGATIVE
Staphylococcus aureus: NEGATIVE

## 2022-05-14 NOTE — Telephone Encounter (Signed)
I spoke with orthopedic service will require the patient to come off of aspirin for 5 days.  I have reviewed his chart, he does not have prior history of MI.  He has a history of elevated coronary calcium score however had a negative Myoview in October of last year.  Overall, he is low risk for the intended procedure and should be able to hold aspirin for 5 days prior to the procedure and then restart as soon as possible afterward at the surgeon's discretion.

## 2022-05-14 NOTE — Progress Notes (Addendum)
For Short Stay: [place] appointment date: Date of COVID positive in last 79 days:  Bowel Prep reminder:   For Anesthesia: PCP - Dr. Melinda Crutch.: Clearance: 04/18/22: Chart Cardiologist - Dr. Peter Martinique.LOV: 09/25/21  Chest x-ray -  EKG - 09/25/21 Stress Test - 09/27/21 ECHO -  Cardiac Cath -  Pacemaker/ICD device last checked: Pacemaker orders received: Device Rep notified:  Spinal Cord Stimulator:  Sleep Study -  CPAP -   Fasting Blood Sugar -  Checks Blood Sugar _____ times a day Date and result of last Hgb A1c-  Blood Thinner Instructions: Aspirin Instructions: Will be held 5 days before surgery. Last Dose:  Activity level: Can go up a flight of stairs and activities of daily living without stopping and without chest pain and/or shortness of breath   Able to exercise without chest pain and/or shortness of breath   Unable to go up a flight of stairs without chest pain and/or shortness of breath     Anesthesia review: Hx: HTN,CAD  Patient denies shortness of breath, fever, cough and chest pain at PAT appointment   Patient verbalized understanding of instructions that were given to them at the PAT appointment. Patient was also instructed that they will need to review over the PAT instructions again at home before surgery.

## 2022-05-14 NOTE — H&P (View-Only) (Signed)
TOTAL KNEE ADMISSION H&P  Patient is being admitted for left total knee arthroplasty.  Subjective:  Chief Complaint:left knee pain.  HPI: Kenneth Boone, 61 y.o. male, has a history of pain and functional disability in the left knee due to trauma and arthritis and has failed non-surgical conservative treatments for greater than 12 weeks to includeNSAID's and/or analgesics, corticosteriod injections, viscosupplementation injections, flexibility and strengthening excercises, and activity modification.  Onset of symptoms was gradual, starting 10 years ago with rapidlly worsening course since that time. The patient noted no past surgery on the left knee(s).  Patient currently rates pain in the left knee(s) at 8 out of 10 with activity. Patient has night pain, worsening of pain with activity and weight bearing, pain that interferes with activities of daily living, pain with passive range of motion, and crepitus.  Patient has evidence of subchondral sclerosis, periarticular osteophytes, and joint space narrowing by imaging studies. There is no active infection.  There are no problems to display for this patient.  Past Medical History:  Diagnosis Date   Cancer ([place])    Hyperlipidemia    Hypertension    Osteoarthritis     Past Surgical History:  Procedure Laterality Date   NO PAST SURGERIES     WISDOM TOOTH EXTRACTION      Current Outpatient Medications  Medication Sig Dispense Refill Last Dose   Ascorbic Acid (VITAMIN C) 1000 MG tablet Take 2,000 mg by mouth daily.      aspirin EC 81 MG tablet Take 81 mg by mouth daily. Swallow whole.      carboxymethylcellulose (REFRESH PLUS) 0.5 % SOLN Place 1 drop into both eyes 3 (three) times daily as needed (dry/irritated eyes).      losartan (COZAAR) 100 MG tablet Take 100 mg by mouth daily.      PRESCRIPTION MEDICATION Apply 1 application. topically daily. Curasol      rosuvastatin (CRESTOR) 40 MG tablet Take 40 mg by mouth daily.      No  current facility-administered medications for this visit.   No Known Allergies  Social History   Tobacco Use   Smoking status: Never    Passive exposure: Never   Smokeless tobacco: Never  Substance Use Topics   Alcohol use: Yes    Comment: occas.    Family History  Problem Relation Age of Onset   Diabetes Mellitus II Mother    Heart attack Father 81   Stroke Father    Diabetes Mellitus II Sister    Diabetes Mellitus II Brother      Review of Systems  Musculoskeletal:  Positive for arthralgias, gait problem and myalgias.  All other systems reviewed and are negative.  Objective:  Physical Exam Constitutional:      Appearance: Normal appearance.  HENT:     Head: Normocephalic and atraumatic.     Nose: Nose normal.     Mouth/Throat:     Mouth: Mucous membranes are moist.     Pharynx: Oropharynx is clear.  Eyes:     Extraocular Movements: Extraocular movements intact.  Cardiovascular:     Rate and Rhythm: Normal rate and regular rhythm.     Pulses: Normal pulses.     Heart sounds: Normal heart sounds.  Pulmonary:     Effort: Pulmonary effort is normal.     Breath sounds: Normal breath sounds.  Abdominal:     General: Abdomen is flat.     Palpations: Abdomen is soft.  Genitourinary:    Comments: Deferred  Musculoskeletal:     Cervical back: Normal range of motion and neck supple.     Left knee: Bony tenderness and crepitus present. Decreased range of motion. Tenderness present over the medial joint line and lateral joint line.  Skin:    General: Skin is warm and dry.     Capillary Refill: Capillary refill takes less than 2 seconds.  Neurological:     General: No focal deficit present.     Mental Status: He is alert and oriented to person, place, and time.  Psychiatric:        Mood and Affect: Mood normal.        Behavior: Behavior normal.        Thought Content: Thought content normal.        Judgment: Judgment normal.    Vital signs in last 24  hours: '@VSRANGES'$ @  Labs:   Estimated body mass index is 28.48 kg/m as calculated from the following:   Height as of an earlier encounter on 05/14/22: 6' (1.829 m).   Weight as of an earlier encounter on 05/14/22: 95.3 kg.   Imaging Review Plain radiographs demonstrate severe degenerative joint disease of the left knee(s). The overall alignment ismild varus. The bone quality appears to be adequate for age and reported activity level.      Assessment/Plan:  End stage arthritis, left knee   The patient history, physical examination, clinical judgment of the provider and imaging studies are consistent with end stage degenerative joint disease of the left knee(s) and total knee arthroplasty is deemed medically necessary. The treatment options including medical management, injection therapy arthroscopy and arthroplasty were discussed at length. The risks and benefits of total knee arthroplasty were presented and reviewed. The risks due to aseptic loosening, infection, stiffness, patella tracking problems, thromboembolic complications and other imponderables were discussed. The patient acknowledged the explanation, agreed to proceed with the plan and consent was signed. Patient is being admitted for inpatient treatment for surgery, pain control, PT, OT, prophylactic antibiotics, VTE prophylaxis, progressive ambulation and ADL's and discharge planning. The patient is planning to be discharged home with OPPT at [facility].   Therapy Plans: outpatient therapy at [facility] Disposition: Home with wife Planned DVT Prophylaxis: aspirin '81mg'$  BID DME needed: walker and cane. Iceman order. Patient is workers Tax adviser. PCP: Cleared Cardology: Cleared. Patient to hold aspirin '81mg'$  daily. Called office.  TXA: Yes Allergies: NDKA Anesthesia Concerns: None BMI: 28.8 Last HgbA1c: Not diabetic Other: - Oxycodone, meloxicam, methocarbamol, tylenol, zofran. - Workers comp    Patient's anticipated LOS is less  than 2 midnights, meeting these requirements: - Younger than 9 - Lives within 1 hour of care - Has a competent adult at home to recover with post-op recover - NO history of  - Chronic pain requiring opiods  - Diabetes  - Heart failure  - Heart attack  - Stroke  - DVT/VTE  - Cardiac arrhythmia  - Respiratory Failure/COPD  - Renal failure  - Anemia  - Advanced Liver disease

## 2022-05-14 NOTE — H&P (Signed)
TOTAL KNEE ADMISSION H&P  Patient is being admitted for left total knee arthroplasty.  Subjective:  Chief Complaint:left knee pain.  HPI: Kenneth Boone, 61 y.o. male, has a history of pain and functional disability in the left knee due to trauma and arthritis and has failed non-surgical conservative treatments for greater than 12 weeks to includeNSAID's and/or analgesics, corticosteriod injections, viscosupplementation injections, flexibility and strengthening excercises, and activity modification.  Onset of symptoms was gradual, starting 10 years ago with rapidlly worsening course since that time. The patient noted no past surgery on the left knee(s).  Patient currently rates pain in the left knee(s) at 8 out of 10 with activity. Patient has night pain, worsening of pain with activity and weight bearing, pain that interferes with activities of daily living, pain with passive range of motion, and crepitus.  Patient has evidence of subchondral sclerosis, periarticular osteophytes, and joint space narrowing by imaging studies. There is no active infection.  There are no problems to display for this patient.  Past Medical History:  Diagnosis Date   Cancer ([place])    Hyperlipidemia    Hypertension    Osteoarthritis     Past Surgical History:  Procedure Laterality Date   NO PAST SURGERIES     WISDOM TOOTH EXTRACTION      Current Outpatient Medications  Medication Sig Dispense Refill Last Dose   Ascorbic Acid (VITAMIN C) 1000 MG tablet Take 2,000 mg by mouth daily.      aspirin EC 81 MG tablet Take 81 mg by mouth daily. Swallow whole.      carboxymethylcellulose (REFRESH PLUS) 0.5 % SOLN Place 1 drop into both eyes 3 (three) times daily as needed (dry/irritated eyes).      losartan (COZAAR) 100 MG tablet Take 100 mg by mouth daily.      PRESCRIPTION MEDICATION Apply 1 application. topically daily. Curasol      rosuvastatin (CRESTOR) 40 MG tablet Take 40 mg by mouth daily.      No  current facility-administered medications for this visit.   No Known Allergies  Social History   Tobacco Use   Smoking status: Never    Passive exposure: Never   Smokeless tobacco: Never  Substance Use Topics   Alcohol use: Yes    Comment: occas.    Family History  Problem Relation Age of Onset   Diabetes Mellitus II Mother    Heart attack Father 22   Stroke Father    Diabetes Mellitus II Sister    Diabetes Mellitus II Brother      Review of Systems  Musculoskeletal:  Positive for arthralgias, gait problem and myalgias.  All other systems reviewed and are negative.  Objective:  Physical Exam Constitutional:      Appearance: Normal appearance.  HENT:     Head: Normocephalic and atraumatic.     Nose: Nose normal.     Mouth/Throat:     Mouth: Mucous membranes are moist.     Pharynx: Oropharynx is clear.  Eyes:     Extraocular Movements: Extraocular movements intact.  Cardiovascular:     Rate and Rhythm: Normal rate and regular rhythm.     Pulses: Normal pulses.     Heart sounds: Normal heart sounds.  Pulmonary:     Effort: Pulmonary effort is normal.     Breath sounds: Normal breath sounds.  Abdominal:     General: Abdomen is flat.     Palpations: Abdomen is soft.  Genitourinary:    Comments: Deferred  Musculoskeletal:     Cervical back: Normal range of motion and neck supple.     Left knee: Bony tenderness and crepitus present. Decreased range of motion. Tenderness present over the medial joint line and lateral joint line.  Skin:    General: Skin is warm and dry.     Capillary Refill: Capillary refill takes less than 2 seconds.  Neurological:     General: No focal deficit present.     Mental Status: He is alert and oriented to person, place, and time.  Psychiatric:        Mood and Affect: Mood normal.        Behavior: Behavior normal.        Thought Content: Thought content normal.        Judgment: Judgment normal.    Vital signs in last 24  hours: '@VSRANGES'$ @  Labs:   Estimated body mass index is 28.48 kg/m as calculated from the following:   Height as of an earlier encounter on 05/14/22: 6' (1.829 m).   Weight as of an earlier encounter on 05/14/22: 95.3 kg.   Imaging Review Plain radiographs demonstrate severe degenerative joint disease of the left knee(s). The overall alignment ismild varus. The bone quality appears to be adequate for age and reported activity level.      Assessment/Plan:  End stage arthritis, left knee   The patient history, physical examination, clinical judgment of the provider and imaging studies are consistent with end stage degenerative joint disease of the left knee(s) and total knee arthroplasty is deemed medically necessary. The treatment options including medical management, injection therapy arthroscopy and arthroplasty were discussed at length. The risks and benefits of total knee arthroplasty were presented and reviewed. The risks due to aseptic loosening, infection, stiffness, patella tracking problems, thromboembolic complications and other imponderables were discussed. The patient acknowledged the explanation, agreed to proceed with the plan and consent was signed. Patient is being admitted for inpatient treatment for surgery, pain control, PT, OT, prophylactic antibiotics, VTE prophylaxis, progressive ambulation and ADL's and discharge planning. The patient is planning to be discharged home with OPPT at [facility].   Therapy Plans: outpatient therapy at [facility] Disposition: Home with wife Planned DVT Prophylaxis: aspirin '81mg'$  BID DME needed: walker and cane. Iceman order. Patient is workers Tax adviser. PCP: Cleared Cardology: Cleared. Patient to hold aspirin '81mg'$  daily. Called office.  TXA: Yes Allergies: NDKA Anesthesia Concerns: None BMI: 28.8 Last HgbA1c: Not diabetic Other: - Oxycodone, meloxicam, methocarbamol, tylenol, zofran. - Workers comp    Patient's anticipated LOS is less  than 2 midnights, meeting these requirements: - Younger than 40 - Lives within 1 hour of care - Has a competent adult at home to recover with post-op recover - NO history of  - Chronic pain requiring opiods  - Diabetes  - Heart failure  - Heart attack  - Stroke  - DVT/VTE  - Cardiac arrhythmia  - Respiratory Failure/COPD  - Renal failure  - Anemia  - Advanced Liver disease

## 2022-05-16 DIAGNOSIS — M9903 Segmental and somatic dysfunction of lumbar region: Secondary | ICD-10-CM | POA: Diagnosis not present

## 2022-05-22 ENCOUNTER — Other Ambulatory Visit: Payer: Self-pay

## 2022-05-22 ENCOUNTER — Ambulatory Visit (HOSPITAL_COMMUNITY): Payer: Self-pay

## 2022-05-22 ENCOUNTER — Encounter (HOSPITAL_COMMUNITY)
Admission: RE | Disposition: A | Discharge status: Home / Self Care | Payer: Self-pay | Source: Ambulatory Visit | Attending: Orthopedic Surgery

## 2022-05-22 ENCOUNTER — Ambulatory Visit (HOSPITAL_BASED_OUTPATIENT_CLINIC_OR_DEPARTMENT_OTHER): Payer: No Typology Code available for payment source | Admitting: Anesthesiology

## 2022-05-22 ENCOUNTER — Ambulatory Visit (HOSPITAL_COMMUNITY)
Admission: RE | Admit: 2022-05-22 | Discharge: 2022-05-22 | Disposition: A | Discharge status: Home / Self Care | Payer: No Typology Code available for payment source | Source: Ambulatory Visit | Attending: Orthopedic Surgery | Admitting: Orthopedic Surgery

## 2022-05-22 ENCOUNTER — Encounter (HOSPITAL_COMMUNITY): Payer: Self-pay | Admitting: Orthopedic Surgery

## 2022-05-22 ENCOUNTER — Ambulatory Visit (HOSPITAL_COMMUNITY): Payer: No Typology Code available for payment source | Admitting: Physician Assistant

## 2022-05-22 DIAGNOSIS — M1712 Unilateral primary osteoarthritis, left knee: Secondary | ICD-10-CM | POA: Diagnosis present

## 2022-05-22 DIAGNOSIS — Z79899 Other long term (current) drug therapy: Secondary | ICD-10-CM | POA: Insufficient documentation

## 2022-05-22 DIAGNOSIS — I10 Essential (primary) hypertension: Secondary | ICD-10-CM | POA: Insufficient documentation

## 2022-05-22 HISTORY — PX: KNEE ARTHROPLASTY: SHX992

## 2022-05-22 SURGERY — ARTHROPLASTY, KNEE, TOTAL, USING IMAGELESS COMPUTER-ASSISTED NAVIGATION
Anesthesia: Spinal | Site: Knee | Laterality: Left

## 2022-05-22 MED ORDER — PROPOFOL 500 MG/50ML IV EMUL
INTRAVENOUS | Status: DC | PRN
  Administered 2022-05-22 (×2): 35 mg via INTRAVENOUS
  Administered 2022-05-22: 80 ug/kg/min via INTRAVENOUS

## 2022-05-22 MED ORDER — SODIUM CHLORIDE (PF) 0.9 % IJ SOLN
INTRAMUSCULAR | Status: DC | PRN
  Administered 2022-05-22: 30 mL

## 2022-05-22 MED ORDER — OXYCODONE HCL 5 MG PO TABS
ORAL_TABLET | ORAL | Status: AC
  Filled 2022-05-22: qty 1

## 2022-05-22 MED ORDER — METHOCARBAMOL 500 MG PO TABS: 500.0000 mg | ORAL_TABLET | Freq: Four times a day (QID) | ORAL | 0 refills | Status: AC | PRN

## 2022-05-22 MED ORDER — LIDOCAINE 2% (20 MG/ML) 5 ML SYRINGE
INTRAMUSCULAR | Status: DC | PRN
  Administered 2022-05-22: 60 mg via INTRAVENOUS

## 2022-05-22 MED ORDER — ACETAMINOPHEN 500 MG PO TABS
1000.0000 mg | ORAL_TABLET | Freq: Once | ORAL | Status: AC
  Administered 2022-05-22: 1000 mg via ORAL
  Filled 2022-05-22: qty 2

## 2022-05-22 MED ORDER — POVIDONE-IODINE 10 % EX SWAB
2.0000 "application " | Freq: Once | CUTANEOUS | Status: AC
  Administered 2022-05-22: 2 via TOPICAL

## 2022-05-22 MED ORDER — MIDAZOLAM HCL 2 MG/2ML IJ SOLN
INTRAMUSCULAR | Status: AC
  Administered 2022-05-22: 2 mg
  Filled 2022-05-22: qty 2

## 2022-05-22 MED ORDER — ROPIVACAINE HCL 5 MG/ML IJ SOLN
INTRAMUSCULAR | Status: DC | PRN
  Administered 2022-05-22: 20 mL via PERINEURAL

## 2022-05-22 MED ORDER — ORAL CARE MOUTH RINSE: 15.0000 mL | Freq: Once | OROMUCOSAL | Status: AC

## 2022-05-22 MED ORDER — BUPIVACAINE-EPINEPHRINE 0.25% -1:200000 IJ SOLN
INTRAMUSCULAR | Status: DC | PRN
  Administered 2022-05-22: 30 mL

## 2022-05-22 MED ORDER — AMISULPRIDE (ANTIEMETIC) 5 MG/2ML IV SOLN: 10.0000 mg | Freq: Once | INTRAVENOUS | Status: DC | PRN

## 2022-05-22 MED ORDER — LACTATED RINGERS IV BOLUS
500.0000 mL | Freq: Once | INTRAVENOUS | Status: AC
Start: 2022-05-22 — End: 2022-05-22
  Administered 2022-05-22: 500 mL via INTRAVENOUS

## 2022-05-22 MED ORDER — OXYCODONE HCL 5 MG PO TABS: 5.0000 mg | ORAL_TABLET | ORAL | 0 refills | Status: AC | PRN

## 2022-05-22 MED ORDER — LACTATED RINGERS IV BOLUS
250.0000 mL | Freq: Once | INTRAVENOUS | Status: AC
Start: 2022-05-22 — End: 2022-05-22
  Administered 2022-05-22: 250 mL via INTRAVENOUS

## 2022-05-22 MED ORDER — FENTANYL CITRATE PF 50 MCG/ML IJ SOSY: 25.0000 ug | PREFILLED_SYRINGE | INTRAMUSCULAR | Status: DC | PRN

## 2022-05-22 MED ORDER — CLONIDINE HCL (ANALGESIA) 100 MCG/ML EP SOLN
EPIDURAL | Status: DC | PRN
  Administered 2022-05-22: 50 ug

## 2022-05-22 MED ORDER — METHOCARBAMOL 500 MG IVPB - SIMPLE MED: 500.0000 mg | Freq: Four times a day (QID) | INTRAVENOUS | Status: DC | PRN

## 2022-05-22 MED ORDER — SENNA 8.6 MG PO TABS: 2.0000 | ORAL_TABLET | Freq: Every day | ORAL | 1 refills | Status: AC

## 2022-05-22 MED ORDER — METHOCARBAMOL 500 MG IVPB - SIMPLE MED
INTRAVENOUS | Status: AC
  Filled 2022-05-22: qty 50

## 2022-05-22 MED ORDER — DEXAMETHASONE SODIUM PHOSPHATE 10 MG/ML IJ SOLN
INTRAMUSCULAR | Status: DC | PRN
  Administered 2022-05-22: 10 mg via INTRAVENOUS

## 2022-05-22 MED ORDER — MELOXICAM 15 MG PO TABS: 15.0000 mg | ORAL_TABLET | Freq: Every day | ORAL | 3 refills | Status: DC

## 2022-05-22 MED ORDER — OXYCODONE HCL 5 MG PO TABS
5.0000 mg | ORAL_TABLET | ORAL | Status: DC | PRN
  Administered 2022-05-22: 5 mg via ORAL

## 2022-05-22 MED ORDER — ONDANSETRON HCL 4 MG/2ML IJ SOLN
INTRAMUSCULAR | Status: DC | PRN
  Administered 2022-05-22: 4 mg via INTRAVENOUS

## 2022-05-22 MED ORDER — LACTATED RINGERS IV SOLN: INTRAVENOUS | Status: DC

## 2022-05-22 MED ORDER — ONDANSETRON HCL 4 MG PO TABS: 4.0000 mg | ORAL_TABLET | Freq: Three times a day (TID) | ORAL | 0 refills | Status: DC | PRN

## 2022-05-22 MED ORDER — CEFAZOLIN SODIUM-DEXTROSE 2-4 GM/100ML-% IV SOLN: 2.0000 g | Freq: Four times a day (QID) | INTRAVENOUS | Status: DC

## 2022-05-22 MED ORDER — TRANEXAMIC ACID-NACL 1000-0.7 MG/100ML-% IV SOLN
1000.0000 mg | INTRAVENOUS | Status: AC
  Administered 2022-05-22: 1000 mg via INTRAVENOUS
  Filled 2022-05-22: qty 100

## 2022-05-22 MED ORDER — KETOROLAC TROMETHAMINE 15 MG/ML IJ SOLN: 15.0000 mg | Freq: Four times a day (QID) | INTRAMUSCULAR | Status: DC

## 2022-05-22 MED ORDER — 0.9 % SODIUM CHLORIDE (POUR BTL) OPTIME
TOPICAL | Status: DC | PRN
  Administered 2022-05-22: 1000 mL

## 2022-05-22 MED ORDER — FENTANYL CITRATE PF 50 MCG/ML IJ SOSY
PREFILLED_SYRINGE | INTRAMUSCULAR | Status: AC
  Administered 2022-05-22: 50 ug
  Filled 2022-05-22: qty 2

## 2022-05-22 MED ORDER — OXYCODONE HCL 5 MG PO TABS: 10.0000 mg | ORAL_TABLET | ORAL | Status: DC | PRN

## 2022-05-22 MED ORDER — SODIUM CHLORIDE 0.9 % IR SOLN
Status: DC | PRN
  Administered 2022-05-22: 1000 mL
  Administered 2022-05-22: 3000 mL

## 2022-05-22 MED ORDER — CEFAZOLIN SODIUM-DEXTROSE 2-4 GM/100ML-% IV SOLN
2.0000 g | INTRAVENOUS | Status: AC
  Administered 2022-05-22: 2 g via INTRAVENOUS
  Filled 2022-05-22: qty 100

## 2022-05-22 MED ORDER — BUPIVACAINE-EPINEPHRINE (PF) 0.25% -1:200000 IJ SOLN
INTRAMUSCULAR | Status: AC
Start: 2022-05-22 — End: ?
  Filled 2022-05-22: qty 30

## 2022-05-22 MED ORDER — SODIUM CHLORIDE (PF) 0.9 % IJ SOLN
INTRAMUSCULAR | Status: AC
  Filled 2022-05-22: qty 50

## 2022-05-22 MED ORDER — OXYCODONE HCL 5 MG PO TABS
ORAL_TABLET | ORAL | Status: AC
  Administered 2022-05-22: 5 mg via ORAL
  Filled 2022-05-22: qty 1

## 2022-05-22 MED ORDER — KETOROLAC TROMETHAMINE 30 MG/ML IJ SOLN
INTRAMUSCULAR | Status: DC | PRN
  Administered 2022-05-22: 30 mg

## 2022-05-22 MED ORDER — POLYETHYLENE GLYCOL 3350 17 G PO PACK: 17.0000 g | PACK | Freq: Every day | ORAL | 0 refills | Status: DC | PRN

## 2022-05-22 MED ORDER — ISOPROPYL ALCOHOL 70 % SOLN
Status: DC | PRN
  Administered 2022-05-22: 1 via TOPICAL

## 2022-05-22 MED ORDER — ASPIRIN 81 MG PO CHEW: 81.0000 mg | CHEWABLE_TABLET | Freq: Two times a day (BID) | ORAL | 0 refills | Status: AC

## 2022-05-22 MED ORDER — CHLORHEXIDINE GLUCONATE 0.12 % MT SOLN
15.0000 mL | Freq: Once | OROMUCOSAL | Status: AC
  Administered 2022-05-22: 15 mL via OROMUCOSAL

## 2022-05-22 MED ORDER — DOCUSATE SODIUM 100 MG PO CAPS: 100.0000 mg | ORAL_CAPSULE | Freq: Two times a day (BID) | ORAL | 1 refills | Status: AC

## 2022-05-22 MED ORDER — STERILE WATER FOR IRRIGATION IR SOLN
Status: DC | PRN
  Administered 2022-05-22: 2000 mL

## 2022-05-22 MED ORDER — METHOCARBAMOL 500 MG PO TABS: 500.0000 mg | ORAL_TABLET | Freq: Four times a day (QID) | ORAL | Status: DC | PRN

## 2022-05-22 MED ORDER — KETOROLAC TROMETHAMINE 30 MG/ML IJ SOLN
INTRAMUSCULAR | Status: AC
Start: 2022-05-22 — End: ?
  Filled 2022-05-22: qty 1

## 2022-05-22 SURGICAL SUPPLY — 77 items
ADH SKN CLS APL DERMABOND .7 (GAUZE/BANDAGES/DRESSINGS) ×1
APL PRP STRL LF DISP 70% ISPRP (MISCELLANEOUS) ×2
BAG COUNTER SPONGE SURGICOUNT (BAG) ×1 IMPLANT
BAG SPEC THK2 15X12 ZIP CLS (MISCELLANEOUS)
BAG SPNG CNTER NS LX DISP (BAG) ×1
BAG ZIPLOCK 12X15 (MISCELLANEOUS) IMPLANT
BATTERY INSTRU NAVIGATION (MISCELLANEOUS) ×6 IMPLANT
BLADE SAW RECIPROCATING 77.5 (BLADE) ×2 IMPLANT
BNDG ELASTIC 4X5.8 VLCR STR LF (GAUZE/BANDAGES/DRESSINGS) ×2 IMPLANT
BNDG ELASTIC 6X5.8 VLCR STR LF (GAUZE/BANDAGES/DRESSINGS) ×2 IMPLANT
CHLORAPREP W/TINT 26 (MISCELLANEOUS) ×4 IMPLANT
COMP FEM KNEE STD PS 9 LT (Joint) ×2 IMPLANT
COMP PATELLAR 10X35 METAL (Joint) ×2 IMPLANT
COMP TIB PS G 0D LT (Joint) ×2 IMPLANT
COMPONENT FEM KNEE STD PS 9 LT (Joint) IMPLANT
COMPONENT PATELLAR 10X35 METAL (Joint) IMPLANT
COMPONET TIB PS G 0D LT (Joint) IMPLANT
COVER SURGICAL LIGHT HANDLE (MISCELLANEOUS) ×2 IMPLANT
DERMABOND ADVANCED (GAUZE/BANDAGES/DRESSINGS) ×1
DERMABOND ADVANCED .7 DNX12 (GAUZE/BANDAGES/DRESSINGS) ×2 IMPLANT
DRAPE INCISE IOBAN 66X45 STRL (DRAPES) ×2 IMPLANT
DRAPE SHEET LG 3/4 BI-LAMINATE (DRAPES) ×6 IMPLANT
DRAPE U-SHAPE 47X51 STRL (DRAPES) ×2 IMPLANT
DRESSING AQUACEL AG SP 3.5X10 (GAUZE/BANDAGES/DRESSINGS) IMPLANT
DRSG AQUACEL AG ADV 3.5X14 (GAUZE/BANDAGES/DRESSINGS) ×2 IMPLANT
DRSG AQUACEL AG SP 3.5X10 (GAUZE/BANDAGES/DRESSINGS) ×2
ELECT BLADE TIP CTD 4 INCH (ELECTRODE) ×2 IMPLANT
ELECT REM PT RETURN 15FT ADLT (MISCELLANEOUS) ×2 IMPLANT
GAUZE SPONGE 4X4 12PLY STRL (GAUZE/BANDAGES/DRESSINGS) ×2 IMPLANT
GLOVE BIO SURGEON STRL SZ8.5 (GLOVE) ×4 IMPLANT
GLOVE BIOGEL M 7.0 STRL (GLOVE) ×2 IMPLANT
GLOVE BIOGEL PI IND STRL 7.5 (GLOVE) ×1 IMPLANT
GLOVE BIOGEL PI IND STRL 8 (GLOVE) ×2 IMPLANT
GLOVE BIOGEL PI IND STRL 8.5 (GLOVE) ×1 IMPLANT
GLOVE BIOGEL PI INDICATOR 7.5 (GLOVE) ×1
GLOVE BIOGEL PI INDICATOR 8 (GLOVE) ×2
GLOVE BIOGEL PI INDICATOR 8.5 (GLOVE) ×1
GLOVE SURG LX 7.5 STRW (GLOVE) ×3
GLOVE SURG LX STRL 7.5 STRW (GLOVE) ×3 IMPLANT
GOWN SPEC L3 XXLG W/TWL (GOWN DISPOSABLE) ×4 IMPLANT
HANDPIECE INTERPULSE COAX TIP (DISPOSABLE) ×2
HDLS TROCR DRIL PIN KNEE 75 (PIN) ×2
HOLDER FOLEY CATH W/STRAP (MISCELLANEOUS) ×2 IMPLANT
HOOD PEEL AWAY FLYTE STAYCOOL (MISCELLANEOUS) ×6 IMPLANT
KIT TURNOVER KIT A (KITS) ×1 IMPLANT
LINER TIB ASF PS GH/7-12 10 LT (Liner) ×1 IMPLANT
MARKER SKIN DUAL TIP RULER LAB (MISCELLANEOUS) ×2 IMPLANT
NDL SAFETY ECLIPSE 18X1.5 (NEEDLE) ×1 IMPLANT
NDL SPNL 18GX3.5 QUINCKE PK (NEEDLE) ×1 IMPLANT
NEEDLE HYPO 18GX1.5 SHARP (NEEDLE) ×2
NEEDLE SPNL 18GX3.5 QUINCKE PK (NEEDLE) ×2 IMPLANT
NS IRRIG 1000ML POUR BTL (IV SOLUTION) ×2 IMPLANT
PACK TOTAL KNEE CUSTOM (KITS) ×2 IMPLANT
PADDING CAST COTTON 6X4 STRL (CAST SUPPLIES) ×2 IMPLANT
PIN DRILL HDLS TROCAR 75 4PK (PIN) IMPLANT
PROTECTOR NERVE ULNAR (MISCELLANEOUS) ×2 IMPLANT
SAW OSC TIP CART 19.5X105X1.3 (SAW) ×2 IMPLANT
SCREW FEMALE HEX FIX 25X2.5 (ORTHOPEDIC DISPOSABLE SUPPLIES) ×1 IMPLANT
SEALER BIPOLAR AQUA 6.0 (INSTRUMENTS) ×2 IMPLANT
SET HNDPC FAN SPRY TIP SCT (DISPOSABLE) ×1 IMPLANT
SET PAD KNEE POSITIONER (MISCELLANEOUS) ×2 IMPLANT
SOLUTION PRONTOSAN WOUND 350ML (IRRIGATION / IRRIGATOR) ×1 IMPLANT
SPIKE FLUID TRANSFER (MISCELLANEOUS) ×4 IMPLANT
SUT MNCRL AB 3-0 PS2 18 (SUTURE) ×2 IMPLANT
SUT MNCRL AB 4-0 PS2 18 (SUTURE) ×2 IMPLANT
SUT MON AB 2-0 CT1 36 (SUTURE) ×2 IMPLANT
SUT STRATAFIX PDO 1 14 VIOLET (SUTURE) ×2
SUT STRATFX PDO 1 14 VIOLET (SUTURE) ×1
SUT VIC AB 1 CTX 36 (SUTURE) ×4
SUT VIC AB 1 CTX36XBRD ANBCTR (SUTURE) ×2 IMPLANT
SUT VIC AB 2-0 CT1 27 (SUTURE) ×2
SUT VIC AB 2-0 CT1 TAPERPNT 27 (SUTURE) ×1 IMPLANT
SUTURE STRATFX PDO 1 14 VIOLET (SUTURE) ×1 IMPLANT
TRAY FOLEY MTR SLVR 16FR STAT (SET/KITS/TRAYS/PACK) IMPLANT
TUBE SUCTION HIGH CAP CLEAR NV (SUCTIONS) ×2 IMPLANT
WATER STERILE IRR 1000ML POUR (IV SOLUTION) ×4 IMPLANT
WRAP KNEE MAXI GEL POST OP (GAUZE/BANDAGES/DRESSINGS) ×1 IMPLANT

## 2022-05-22 NOTE — Interval H&P Note (Signed)
History and Physical Interval Note:  05/22/2022 10:45 AM  Kenneth Boone  has presented today for surgery, with the diagnosis of Left knee osteoarthritis.  The various methods of treatment have been discussed with the patient and family. After consideration of risks, benefits and other options for treatment, the patient has consented to  Procedure(s) with comments: [name] (Left) - 150 as a surgical intervention.  The patient's history has been reviewed, patient examined, no change in status, stable for surgery.  I have reviewed the patient's chart and labs.  Questions were answered to the patient's satisfaction.    The risks, benefits, and alternatives were discussed with the patient. There are risks associated with the surgery including, but not limited to, problems with anesthesia (death), infection, instability (giving out of the joint), dislocation, differences in leg length/angulation/rotation, fracture of bones, loosening or failure of implants, hematoma (blood accumulation) which may require surgical drainage, blood clots, pulmonary embolism, nerve injury (foot drop and lateral thigh numbness), and blood vessel injury. The patient understands these risks and elects to proceed.    [name]

## 2022-05-22 NOTE — Anesthesia Preprocedure Evaluation (Signed)
Anesthesia Evaluation  Patient identified by MRN, date of birth, ID band Patient awake    Reviewed: Allergy & Precautions, NPO status , Patient's Chart, lab work & pertinent test results  Airway Mallampati: II  TM Distance: >3 FB Neck ROM: Full    Dental  (+) Dental Advisory Given   Pulmonary neg pulmonary ROS,    breath sounds clear to auscultation       Cardiovascular hypertension, Pt. on medications  Rhythm:Regular Rate:Normal     Neuro/Psych negative neurological ROS     GI/Hepatic negative GI ROS, Neg liver ROS,   Endo/Other  negative endocrine ROS  Renal/GU negative Renal ROS     Musculoskeletal  (+) Arthritis ,   Abdominal   Peds  Hematology negative hematology ROS (+)   Anesthesia Other Findings   Reproductive/Obstetrics                             Anesthesia Physical Anesthesia Plan  ASA: 2  Anesthesia Plan: Spinal   Post-op Pain Management: Regional block* and Tylenol PO (pre-op)*   Induction:   PONV Risk Score and Plan: 1 and Propofol infusion, Ondansetron and Treatment may vary due to age or medical condition  Airway Management Planned: Natural Airway and Simple Face Mask  Additional Equipment:   Intra-op Plan:   Post-operative Plan:   Informed Consent: I have reviewed the patients History and Physical, chart, labs and discussed the procedure including the risks, benefits and alternatives for the proposed anesthesia with the patient or authorized representative who has indicated his/her understanding and acceptance.       Plan Discussed with: CRNA  Anesthesia Plan Comments:         Anesthesia Quick Evaluation

## 2022-05-22 NOTE — Progress Notes (Signed)
Assisted Dr. Rob Fitzgerald with left, adductor canal, ultrasound guided block. Side rails up, monitors on throughout procedure. See vital signs in flow sheet. Tolerated Procedure well. 

## 2022-05-22 NOTE — Op Note (Signed)
OPERATIVE REPORT  SURGEON: Rod Can, MD   ASSISTANT: Larene Pickett, PA-C  PREOPERATIVE DIAGNOSIS: Primary Left knee arthritis.   POSTOPERATIVE DIAGNOSIS: Primary Left knee arthritis.   PROCEDURE: Computer assisted Left total knee arthroplasty.   IMPLANTS: Zimmer Persona PPS Cementless CR femur, size 9. Persona 0 degree Spiked Keel OsseoTi Tibia, size G. Vivacit-E polyethelyene insert, size 10 mm, CR. TM standard patella, size 35 mm.  ANESTHESIA:  MAC, Regional, and Spinal  TOURNIQUET TIME: Not utilized.   ESTIMATED BLOOD LOSS:-100 mL    ANTIBIOTICS: 2g Ancef.  DRAINS: None.  COMPLICATIONS: None   CONDITION: PACU - hemodynamically stable.   BRIEF CLINICAL NOTE: Kenneth Boone is a 61 y.o. male with a long-standing history of Left knee arthritis. After failing conservative management, the patient was indicated for total knee arthroplasty. The risks, benefits, and alternatives to the procedure were explained, and the patient elected to proceed.  PROCEDURE IN DETAIL: Adductor canal block was obtained in the pre-op holding area. Once inside the operative room, spinal anesthesia was obtained, and a foley catheter was inserted. The patient was then positioned and the lower extremity was prepped and draped in the normal sterile surgical fashion.  A time-out was called verifying side and site of surgery. The patient received IV antibiotics within 60 minutes of beginning the procedure. A tourniquet was not utilized.   An anterior approach to the knee was performed utilizing a midvastus arthrotomy. A medial release was performed and the patellar fat pad was excised. Stryker imageless navigation was used to cut the distal femur perpendicular to the mechanical axis. A freehand patellar resection was performed, and the patella was sized an prepared with 3 lug holes.  Nagivation was used to make a neutral proximal tibia resection, taking 10 mm of bone from the less affected lateral side  with 3 degrees of slope. The menisci were excised. A spacer block was placed, and the alignment and balance in extension were confirmed.   The distal femur was sized using the 3-degree external rotation guide referencing the posterior femoral cortex. The appropriate 4-in-1 cutting block was pinned into place. Rotation was checked using Whiteside's line, the epicondylar axis, and then confirmed with a spacer block in flexion. The remaining femoral cuts were performed, taking care to protect the MCL.  The tibia was sized and the trial tray was pinned into place. The remaining trail components were inserted. The knee was stable to varus and valgus stress through a full range of motion. The patella tracked centrally, and the PCL was well balanced. The trial components were removed, and the proximal tibial surface was prepared. Final components were impacted into place. The knee was tested for a final time and found to be well balanced.   The wound was copiously irrigated with Prontosan solution and normal saline using pule lavage.  Marcaine solution was injected into the periarticular soft tissue.  The wound was closed in layers using #1 Vicryl and Stratafix for the fascia, 2-0 Vicryl for the subcutaneous fat, 2-0 Monocryl for the deep dermal layer, 3-0 running Monocryl subcuticular Stitch, and 4-0 Monocryl stay sutures at both ends of the wound. Dermabond was applied to the skin.  Once the glue was fully dried, an Aquacell Ag and compressive dressing were applied.  The patient was transported to the recovery room in stable condition.  Sponge, needle, and instrument counts were correct at the end of the case x2.  The patient tolerated the procedure well and there were no known complications.  Please note that a surgical assistant was a medical necessity for this procedure in order to perform it in a safe and expeditious manner. Surgical assistant was necessary to retract the ligaments and vital neurovascular  structures to prevent injury to them and also necessary for proper positioning of the limb to allow for anatomic placement of the prosthesis.

## 2022-05-22 NOTE — Evaluation (Signed)
Physical Therapy Evaluation Patient Details Name: Kenneth Boone MRN: [id number] DOB: Mar 22, 1961 Today's Date: 05/22/2022  History of Present Illness  Pt is a 61yo male presenting s/p L-TKA on 05/22/22. PMH: HLD, HTN, OA  Clinical Impression  Kenneth Boone is a 61 y.o. male POD 0 s/p L-TKA. Patient reports independence with mobility at baseline. Patient is now limited by functional impairments (see PT problem list below) and requires min guard for transfers and gait with RW. Patient was able to ambulate 120 feet with RW and min guard and cues for safe walker management. Patient educated on safe sequencing for stair mobility and verbalized safe guarding position for people assisting with mobility. Patient instructed in exercises to facilitate ROM and circulation. Patient will benefit from continued skilled PT interventions to address impairments and progress towards PLOF. Patient has met mobility goals at adequate level for discharge home; will continue to follow if pt continues acute stay to progress towards Mod I goals.       Recommendations for follow up therapy are one component of a multi-disciplinary discharge planning process, led by the attending physician.  Recommendations may be updated based on patient status, additional functional criteria and insurance authorization.  Follow Up Recommendations Follow physician's recommendations for discharge plan and follow up therapies    Assistance Recommended at Discharge Intermittent Supervision/Assistance  Patient can return home with the following  A little help with walking and/or transfers;A little help with bathing/dressing/bathroom;Assistance with cooking/housework;Help with stairs or ramp for entrance;Assist for transportation    Equipment Recommendations None recommended by PT  Recommendations for Other Services       Functional Status Assessment Patient has had a recent decline in their functional status and demonstrates the  ability to make significant improvements in function in a reasonable and predictable amount of time.     Precautions / Restrictions Precautions Precautions: None Restrictions Weight Bearing Restrictions: No Other Position/Activity Restrictions: WBAT      Mobility  Bed Mobility Overal bed mobility: Needs Assistance Bed Mobility: Supine to Sit     Supine to sit: Supervision     General bed mobility comments: For safety only, no physical assist required    Transfers Overall transfer level: Needs assistance Equipment used: Rolling walker (2 wheels) Transfers: Sit to/from Stand Sit to Stand: Min guard           General transfer comment: For safety only, no physical assist required    Ambulation/Gait Ambulation/Gait assistance: Min guard, Supervision Gait Distance (Feet): 120 Feet Assistive device: Rolling walker (2 wheels) Gait Pattern/deviations: Step-to pattern Gait velocity: decreased     General Gait Details: Pt ambulated with RW and min guard progressed to supervision, no overt LOB noted.  Stairs Stairs: Yes Stairs assistance: Supervision, Min guard Stair Management: One rail Right, Step to pattern, Forwards, With cane Number of Stairs: 4 General stair comments: Pt completed two sets of stair training: backwards with RW and forward with 1 railing and cane. Pt and wife educated on safe technique and appropriate guarding for both, verbalized understanding. Pt and wife demonstrated safe technique with VCs, supervision level of assist, no overt LOB.  Wheelchair Mobility    Modified Rankin (Stroke Patients Only)       Balance Overall balance assessment: Needs assistance Sitting-balance support: Feet supported, No upper extremity supported Sitting balance-Leahy Scale: Fair     Standing balance support: Bilateral upper extremity supported, No upper extremity supported, Reliant on assistive device for balance Standing balance-Leahy Scale: Poor  Pertinent Vitals/Pain Pain Assessment Pain Assessment: 0-10 Pain Score: 6  Pain Location: RIGHT knee Pain Descriptors / Indicators: Operative site guarding Pain Intervention(s): Limited activity within patient's tolerance, Monitored during session, Repositioned, Ice applied    Home Living Family/patient expects to be discharged to:: Private residence Living Arrangements: Spouse/significant other;Children Available Help at Discharge: Family;Available 24 hours/day Type of Home: House Home Access: Stairs to enter Entrance Stairs-Rails: None Entrance Stairs-Number of Steps: 2 Alternate Level Stairs-Number of Steps: 13 Home Layout: Two level;Bed/bath upstairs Home Equipment: Conservation officer, nature (2 wheels)      Prior Function Prior Level of Function : Independent/Modified Independent             Mobility Comments: IND ADLs Comments: IND     Hand Dominance   Dominant Hand: Right    Extremity/Trunk Assessment   Upper Extremity Assessment Upper Extremity Assessment: Overall WFL for tasks assessed    Lower Extremity Assessment Lower Extremity Assessment: LLE deficits/detail;RLE deficits/detail RLE Deficits / Details: MMT ank DF/PF 5/5 RLE Sensation: WNL LLE Deficits / Details: MMT ank DF/PF 5/5, no extensor lag noted LLE Sensation: WNL    Cervical / Trunk Assessment Cervical / Trunk Assessment: Normal  Communication   Communication: No difficulties  Cognition Arousal/Alertness: Awake/alert Behavior During Therapy: WFL for tasks assessed/performed Overall Cognitive Status: Within Functional Limits for tasks assessed                                          General Comments      Exercises Total Joint Exercises Ankle Circles/Pumps: AROM, Both, 20 reps Quad Sets: AROM, Left, Other reps (comment) (3) Short Arc Quad: AROM, Left, Other reps (comment) (3) Heel Slides: AROM, Right, 5 reps Hip ABduction/ADduction: AROM,  Left, 5 reps Straight Leg Raises: AROM, Left, Other reps (comment) (3)   Assessment/Plan    PT Assessment Patient needs continued PT services  PT Problem List Decreased strength;Decreased range of motion;Decreased activity tolerance;Decreased balance;Decreased mobility;Decreased coordination;Decreased knowledge of use of DME;Pain       PT Treatment Interventions DME instruction;Gait training;Stair training;Functional mobility training;Therapeutic activities;Therapeutic exercise;Balance training;Neuromuscular re-education;Patient/family education    PT Goals (Current goals can be found in the Care Plan section)  Acute Rehab PT Goals Patient Stated Goal: walking a mile with no pain, golfing PT Goal Formulation: With patient Time For Goal Achievement: 05/29/22 Potential to Achieve Goals: Good    Frequency 7X/week     Co-evaluation               AM-PAC PT "6 Clicks" Mobility  Outcome Measure Help needed turning from your back to your side while in a flat bed without using bedrails?: None Help needed moving from lying on your back to sitting on the side of a flat bed without using bedrails?: None Help needed moving to and from a bed to a chair (including a wheelchair)?: A Little Help needed standing up from a chair using your arms (e.g., wheelchair or bedside chair)?: A Little Help needed to walk in hospital room?: A Little Help needed climbing 3-5 steps with a railing? : A Little 6 Click Score: 20    End of Session Equipment Utilized During Treatment: Gait belt Activity Tolerance: Patient tolerated treatment well;No increased pain Patient left: in chair;with call bell/phone within reach;with family/visitor present Nurse Communication: Mobility status PT Visit Diagnosis: Difficulty in walking, not elsewhere classified (R26.2);Pain Pain - Right/Left:  Left Pain - part of body: Knee    Time: 4353-9122 PT Time Calculation (min) (ACUTE ONLY): 46 min   Charges:   PT  Evaluation $PT Eval Low Complexity: 1 Low PT Treatments $Gait Training: 8-22 mins $Therapeutic Exercise: 8-22 mins        Coolidge Breeze, PT, DPT [facility] Department Office: [phone] Pager: [phone]  Coolidge Breeze 05/22/2022, 6:23 PM

## 2022-05-22 NOTE — Anesthesia Postprocedure Evaluation (Signed)
Anesthesia Post Note  Patient: Kenneth Boone  Procedure(s) Performed: COMPUTER ASSISTED TOTAL KNEE ARTHROPLASTY (Left: Knee)     Patient location during evaluation: PACU Anesthesia Type: Spinal Level of consciousness: awake and alert Pain management: pain level controlled Vital Signs Assessment: post-procedure vital signs reviewed and stable Respiratory status: spontaneous breathing and respiratory function stable Cardiovascular status: blood pressure returned to baseline and stable Postop Assessment: spinal receding Anesthetic complications: no   No notable events documented.  Last Vitals:  Vitals:   05/22/22 1545 05/22/22 1615  BP: (!) 145/83 (!) 145/96  Pulse: 79 77  Resp: 12 15  Temp:  37.1 C  SpO2: 99% 100%    Last Pain:  Vitals:   05/22/22 1120  TempSrc:   PainSc: 0-No pain                 Tiajuana Amass

## 2022-05-22 NOTE — Discharge Instructions (Signed)
 Dr. Morelia Cassells Total Joint Specialist [facility] [address] [place], [place] [address] [phone]  TOTAL KNEE REPLACEMENT POSTOPERATIVE DIRECTIONS    Knee Rehabilitation, Guidelines Following Surgery  Results after knee surgery are often greatly improved when you follow the exercise, range of motion and muscle strengthening exercises prescribed by your doctor. Safety measures are also important to protect the knee from further injury. Any time any of these exercises cause you to have increased pain or swelling in your knee joint, decrease the amount until you are comfortable again and slowly increase them. If you have problems or questions, call your caregiver or physical therapist for advice.   WEIGHT BEARING Weight bearing as tolerated with assist device (walker, cane, etc) as directed, use it as long as suggested by your surgeon or therapist, typically at least 4-6 weeks.  HOME CARE INSTRUCTIONS  Remove items at home which could result in a fall. This includes throw rugs or furniture in walking pathways.  Continue medications as instructed at time of discharge. You may have some home medications which will be placed on hold until you complete the course of blood thinner medication.  You may start showering once you are discharged home but do not submerge the incision under water. Just pat the incision dry and apply a dry gauze dressing on daily. Walk with walker as instructed.  You may resume a sexual relationship in one month or when given the OK by your doctor.  Use walker as long as suggested by your caregivers. Avoid periods of inactivity such as sitting longer than an hour when not asleep. This helps prevent blood clots.  You may put full weight on your legs and walk as much as is comfortable.  You may return to work once you are cleared by your doctor.  Do not drive a car for 6 weeks or until released by you surgeon.  Do not drive while  taking narcotics.  Wear the elastic stockings for three weeks following surgery during the day but you may remove then at night. Make sure you keep all of your appointments after your operation with all of your doctors and caregivers. You should call the office at the above phone number and make an appointment for approximately two weeks after the date of your surgery. Do not remove your surgical dressing. The dressing is waterproof; you may take showers in 3 days, but do not take tub baths or submerge the dressing. Please pick up a stool softener and laxative for home use as long as you are requiring pain medications. ICE to the affected knee every three hours for 30 minutes at a time and then as needed for pain and swelling.  Continue to use ice on the knee for pain and swelling from surgery. You may notice swelling that will progress down to the foot and ankle.  This is normal after surgery.  Elevate the leg when you are not up walking on it.   It is important for you to complete the blood thinner medication as prescribed by your doctor. Continue to use the breathing machine which will help keep your temperature down.  It is common for your temperature to cycle up and down following surgery, especially at night when you are not up moving around and exerting yourself.  The breathing machine keeps your lungs expanded and your temperature down.  RANGE OF MOTION AND STRENGTHENING EXERCISES  Rehabilitation of the knee is important following a knee injury or an   operation. After just a few days of immobilization, the muscles of the thigh which control the knee become weakened and shrink (atrophy). Knee exercises are designed to build up the tone and strength of the thigh muscles and to improve knee motion. Often times heat used for twenty to thirty minutes before working out will loosen up your tissues and help with improving the range of motion but do not use heat for the first two weeks following surgery.  These exercises can be done on a training (exercise) mat, on the floor, on a table or on a bed. Use what ever works the best and is most comfortable for you Knee exercises include:  Leg Lifts - While your knee is still immobilized in a splint or cast, you can do straight leg raises. Lift the leg to 60 degrees, hold for 3 sec, and slowly lower the leg. Repeat 10-20 times 2-3 times daily. Perform this exercise against resistance later as your knee gets better.  Quad and Hamstring Sets - Tighten up the muscle on the front of the thigh (Quad) and hold for 5-10 sec. Repeat this 10-20 times hourly. Hamstring sets are done by pushing the foot backward against an object and holding for 5-10 sec. Repeat as with quad sets.  A rehabilitation program following serious knee injuries can speed recovery and prevent re-injury in the future due to weakened muscles. Contact your doctor or a physical therapist for more information on knee rehabilitation.   POST-OPERATIVE OPIOID TAPER INSTRUCTIONS: It is important to wean off of your opioid medication as soon as possible. If you do not need pain medication after your surgery it is ok to stop day one. Opioids include: Codeine, Hydrocodone(Norco, Vicodin), Oxycodone(Percocet, oxycontin) and hydromorphone amongst others.  Long term and even short term use of opiods can cause: Increased pain response Dependence Constipation Depression Respiratory depression And more.  Withdrawal symptoms can include Flu like symptoms Nausea, vomiting And more Techniques to manage these symptoms Hydrate well Eat regular healthy meals Stay active Use relaxation techniques(deep breathing, meditating, yoga) Do Not substitute Alcohol to help with tapering If you have been on opioids for less than two weeks and do not have pain than it is ok to stop all together.  Plan to wean off of opioids This plan should start within one week post op of your joint replacement. Maintain the same  interval or time between taking each dose and first decrease the dose.  Cut the total daily intake of opioids by one tablet each day Next start to increase the time between doses. The last dose that should be eliminated is the evening dose.    SKILLED REHAB INSTRUCTIONS: If the patient is transferred to a skilled rehab facility following release from the hospital, a list of the current medications will be sent to the facility for the patient to continue.  When discharged from the skilled rehab facility, please have the facility set up the patient's Home Health Physical Therapy prior to being released. Also, the skilled facility will be responsible for providing the patient with their medications at time of release from the facility to include their pain medication, the muscle relaxants, and their blood thinner medication. If the patient is still at the rehab facility at time of the two week follow up appointment, the skilled rehab facility will also need to assist the patient in arranging follow up appointment in our office and any transportation needs.  MAKE SURE YOU:  Understand these instructions.  Will watch   your condition.  Will get help right away if you are not doing well or get worse.    Pick up stool softner and laxative for home use following surgery while on pain medications. Do NOT remove your dressing. You may shower.  Do not take tub baths or submerge incision under water. May shower starting three days after surgery. Please use a clean towel to pat the incision dry following showers. Continue to use ice for pain and swelling after surgery. Do not use any lotions or creams on the incision until instructed by your surgeon.  

## 2022-05-22 NOTE — Anesthesia Procedure Notes (Signed)
Spinal  Patient location during procedure: OR Start time: 05/22/2022 12:35 PM Reason for block: surgical anesthesia Staffing Performed: resident/CRNA  Anesthesiologist: Suzette Battiest, MD Resident/CRNA: Gerald Leitz, CRNA Preanesthetic Checklist Completed: patient identified, IV checked, site marked, risks and benefits discussed, surgical consent, monitors and equipment checked, pre-op evaluation and timeout performed Spinal Block Patient position: sitting Prep: DuraPrep Patient monitoring: heart rate, continuous pulse ox, blood pressure and cardiac monitor Approach: midline Location: L3-4 Injection technique: single-shot Needle Needle type: Whitacre and Introducer  Needle gauge: 24 G Needle length: 9 cm Assessment Sensory level: T4 Events: CSF return Additional Notes Negative paresthesia. Negative blood return. Positive free-flowing CSF. Expiration date of kit checked and confirmed. Patient tolerated procedure well, without complications.

## 2022-05-22 NOTE — Anesthesia Procedure Notes (Signed)
Anesthesia Regional Block: Adductor canal block   Pre-Anesthetic Checklist: , timeout performed,  Correct Patient, Correct Site, Correct Laterality,  Correct Procedure, Correct Position, site marked,  Risks and benefits discussed,  Surgical consent,  Pre-op evaluation,  At surgeon's request and post-op pain management  Laterality: Left  Prep: chloraprep       Needles:  Injection technique: Single-shot  Needle Type: Echogenic Needle     Needle Length: 9cm  Needle Gauge: 21     Additional Needles:   Procedures:,,,, ultrasound used (permanent image in chart),,    Narrative:  Start time: 05/22/2022 11:03 AM End time: 05/22/2022 11:08 AM Injection made incrementally with aspirations every 5 mL.  Performed by: Personally  Anesthesiologist: Suzette Battiest, MD

## 2022-05-22 NOTE — Transfer of Care (Signed)
Immediate Anesthesia Transfer of Care Note  Patient: Kenneth Boone  Procedure(s) Performed: Procedure(s) with comments: COMPUTER ASSISTED TOTAL KNEE ARTHROPLASTY (Left) - 150  Patient Location: PACU  Anesthesia Type:Spinal  Level of Consciousness: awake, alert  and oriented  Airway & Oxygen Therapy: Patient Spontanous Breathing  Post-op Assessment: Report given to RN and Post -op Vital signs reviewed and stable  Post vital signs: Reviewed and stable  Last Vitals:  Vitals:   05/22/22 1115 05/22/22 1120  BP: 131/74 116/79  Pulse: 83 78  Resp: (!) 8 (!) 9  Temp:    SpO2: 337% 445%    Complications: No apparent anesthesia complications

## 2022-05-23 ENCOUNTER — Encounter (HOSPITAL_COMMUNITY): Payer: Self-pay | Admitting: Orthopedic Surgery

## 2022-07-03 DIAGNOSIS — Z0000 Encounter for general adult medical examination without abnormal findings: Secondary | ICD-10-CM | POA: Diagnosis not present

## 2022-07-03 DIAGNOSIS — Z13220 Encounter for screening for lipoid disorders: Secondary | ICD-10-CM | POA: Diagnosis not present

## 2022-07-03 DIAGNOSIS — Z125 Encounter for screening for malignant neoplasm of prostate: Secondary | ICD-10-CM | POA: Diagnosis not present

## 2022-07-10 DIAGNOSIS — R972 Elevated prostate specific antigen [PSA]: Secondary | ICD-10-CM | POA: Diagnosis not present

## 2022-07-10 DIAGNOSIS — E7800 Pure hypercholesterolemia, unspecified: Secondary | ICD-10-CM | POA: Diagnosis not present

## 2022-07-10 DIAGNOSIS — I10 Essential (primary) hypertension: Secondary | ICD-10-CM | POA: Diagnosis not present

## 2022-07-10 DIAGNOSIS — Z0000 Encounter for general adult medical examination without abnormal findings: Secondary | ICD-10-CM | POA: Diagnosis not present

## 2022-07-17 DIAGNOSIS — M9903 Segmental and somatic dysfunction of lumbar region: Secondary | ICD-10-CM | POA: Diagnosis not present

## 2022-07-29 DIAGNOSIS — M9903 Segmental and somatic dysfunction of lumbar region: Secondary | ICD-10-CM | POA: Diagnosis not present

## 2022-08-05 DIAGNOSIS — M9903 Segmental and somatic dysfunction of lumbar region: Secondary | ICD-10-CM | POA: Diagnosis not present

## 2022-08-12 DIAGNOSIS — M9903 Segmental and somatic dysfunction of lumbar region: Secondary | ICD-10-CM | POA: Diagnosis not present

## 2022-08-20 DIAGNOSIS — M9903 Segmental and somatic dysfunction of lumbar region: Secondary | ICD-10-CM | POA: Diagnosis not present

## 2022-08-29 DIAGNOSIS — M9903 Segmental and somatic dysfunction of lumbar region: Secondary | ICD-10-CM | POA: Diagnosis not present

## 2022-09-03 DIAGNOSIS — M9903 Segmental and somatic dysfunction of lumbar region: Secondary | ICD-10-CM | POA: Diagnosis not present

## 2022-09-10 DIAGNOSIS — M9903 Segmental and somatic dysfunction of lumbar region: Secondary | ICD-10-CM | POA: Diagnosis not present

## 2022-09-17 DIAGNOSIS — M9903 Segmental and somatic dysfunction of lumbar region: Secondary | ICD-10-CM | POA: Diagnosis not present

## 2022-10-01 DIAGNOSIS — M9903 Segmental and somatic dysfunction of lumbar region: Secondary | ICD-10-CM | POA: Diagnosis not present

## 2022-10-15 DIAGNOSIS — M9903 Segmental and somatic dysfunction of lumbar region: Secondary | ICD-10-CM | POA: Diagnosis not present

## 2022-10-16 DIAGNOSIS — R972 Elevated prostate specific antigen [PSA]: Secondary | ICD-10-CM | POA: Diagnosis not present

## 2022-10-29 DIAGNOSIS — M9903 Segmental and somatic dysfunction of lumbar region: Secondary | ICD-10-CM | POA: Diagnosis not present

## 2022-11-19 DIAGNOSIS — M9903 Segmental and somatic dysfunction of lumbar region: Secondary | ICD-10-CM | POA: Diagnosis not present

## 2022-12-04 DIAGNOSIS — M9903 Segmental and somatic dysfunction of lumbar region: Secondary | ICD-10-CM | POA: Diagnosis not present

## 2023-03-08 NOTE — Progress Notes (Unsigned)
Cardiology Office Note   Date:  03/12/2023   ID:  Kenneth Boone, Kenneth Boone 11-29-1961, MRN [id number]  PCP:  Lawerance Cruel, MD  Cardiologist:   Erikka Follmer [place], MD   Chief Complaint  Patient presents with   Coronary Artery Disease      History of Present Illness: Kenneth Boone is a 62 y.o. male who is seen for follow up of CAD with abnormal coronary calcium score. He has a history of HTN and HLD. Myoview study in October 2022 was normal.  States he has been on statin for 25 years. When seen by Dr Harrington Challenger coronary calcium score ordered to assess cardiac risk. Found to have severe 3 vessel calcification with score of 1212 placing him at [place] percentile. He denies any chest pain or  palpitations. He did have knee surgery this past year and admits he hasn't been exercising as much as he should. He did spend the last 2 days cutting up and loading trees without difficulty. Did get SOB walking up 4 flights of stairs today. Notes BP at home typically 140/84 with some readings up to 150-160.     Past Medical History:  Diagnosis Date   Cancer ([place])    melanoma to the nose   Hyperlipidemia    Hypertension    Osteoarthritis     Past Surgical History:  Procedure Laterality Date   KNEE ARTHROPLASTY Left 05/22/2022   Procedure: COMPUTER ASSISTED TOTAL KNEE ARTHROPLASTY;  Surgeon: Rod Can, MD;  Location: [facility];  Service: Orthopedics;  Laterality: Left;  150   NO PAST SURGERIES     WISDOM TOOTH EXTRACTION       Current Outpatient Medications  Medication Sig Dispense Refill   Ascorbic Acid (VITAMIN C) 1000 MG tablet Take 2,000 mg by mouth daily.     aspirin EC 81 MG tablet Take 1 tablet (81 mg total) by mouth daily. Swallow whole. 90 tablet 3   carboxymethylcellulose (REFRESH PLUS) 0.5 % SOLN Place 1 drop into both eyes 3 (three) times daily as needed (dry/irritated eyes).     losartan (COZAAR) 100 MG tablet Take 100 mg by mouth daily.     meloxicam (MOBIC) 15 MG tablet Take  1 tablet (15 mg total) by mouth daily. 30 tablet 3   metoprolol succinate (TOPROL XL) 50 MG 24 hr tablet Take 1 tablet (50 mg total) by mouth daily. Take with or immediately following a meal. 90 tablet 3   PRESCRIPTION MEDICATION Apply 1 application. topically daily. Curasol     rosuvastatin (CRESTOR) 40 MG tablet Take 40 mg by mouth daily.     No current facility-administered medications for this visit.    Allergies:   Patient has no known allergies.    Social History:  The patient  reports that he has never smoked. He has never been exposed to tobacco smoke. He has never used smokeless tobacco. He reports current alcohol use. He reports that he does not use drugs.   Family History:  The patient's family history includes Diabetes Mellitus II in his brother, mother, and sister; Heart attack (age of onset: 23) in his father; Stroke in his father.    ROS:  Please see the history of present illness.   Otherwise, review of systems are positive for none.   All other systems are reviewed and negative.    PHYSICAL EXAM: VS:  BP (!) 144/84   Pulse (!) 105   Ht 6' (1.829 m)   Wt 224 lb  6.4 oz (101.8 kg)   SpO2 97%   BMI 30.43 kg/m  , BMI Body mass index is 30.43 kg/m. GEN: Well nourished, well developed, in no acute distress HEENT: normal Neck: no JVD, carotid bruits, or masses Cardiac: RRR;soft [id number] systolic murmur RUSB, no  rubs, or gallops,no edema  Respiratory:  clear to auscultation bilaterally, normal work of breathing GI: soft, nontender, nondistended, + BS MS: no deformity or atrophy Skin: warm and dry, no rash Neuro:  Strength and sensation are intact Psych: euthymic mood, full affect   EKG:  EKG is ordered today. The ekg ordered today demonstrates NSR rate 105. Normal. I have personally reviewed and interpreted this study.    Recent Labs: 05/14/2022: BUN 15; Creatinine, Ser 0.84; Hemoglobin 15.5; Platelets 265; Potassium 4.1; Sodium 138   Dated 06/13/21: cholesterol  262, triglycerides 366. HDL 41, LDL 153. CMET and PSA normal. Dated 07/03/22: cholesterol 188, triglycerides 225, HDL 50, LDL 100. CMET normal.   Lipid Panel No results found for: "CHOL", "TRIG", "HDL", "CHOLHDL", "VLDL", "LDLCALC", "LDLDIRECT"    Wt Readings from Last 3 Encounters:  03/12/23 224 lb 6.4 oz (101.8 kg)  05/14/22 210 lb (95.3 kg)  09/27/21 217 lb (98.4 kg)      Other studies Reviewed: Additional studies/ records that were reviewed today include:   CLINICAL DATA:  62 year old Caucasian male with history of hyperlipidemia, hypertension and family history of heart disease.   EXAM: CT CARDIAC CORONARY ARTERY CALCIUM SCORE   TECHNIQUE: Non-contrast imaging through the heart was performed using prospective ECG gating. Image post processing was performed on an independent workstation, allowing for quantitative analysis of the heart and coronary arteries. Note that this exam targets the heart and the chest was not imaged in its entirety.   COMPARISON:  None.   FINDINGS: CORONARY CALCIUM SCORES:   Left Main: 0   LAD: 959   LCx: 7.5   RCA: 245   Total Agatston Score: 1,212   MESA database percentile: 98   AORTA MEASUREMENTS:   Ascending Aorta: 34 mm   Descending Aorta: 24 mm   OTHER FINDINGS:   The heart size is within normal limits. No pericardial fluid is identified. Visualized segments of the thoracic aorta and central pulmonary arteries are normal in caliber. Visualized mediastinum and hilar regions demonstrate no lymphadenopathy or masses. Visualized lungs show no evidence of pulmonary edema, consolidation, pneumothorax, nodule or pleural fluid. Visualized upper abdomen and bony structures are unremarkable.   IMPRESSION: Coronary calcium score of 1,212 is at the [place] percentile for the patient's age, sex and race.     Electronically Signed   By: Aletta Edouard M.D.   On: 09/11/2021 11:29   Myoview 09/27/21: Study Highlights      The  study is normal. Findings are consistent with no prior ischemia and no prior myocardial infarction. The study is low risk.   Exercise capacity was normal. Patient exercised for 8 min and 11 sec. Maximum HR of 148 bpm. MPHR 91.0 %. Peak METS 9.0 . The patient reached the end of the protocol and achieved the target heart rate. The patient reported fatigue during the stress test. Normal blood pressure and normal heart rate response noted during stress.   No ST deviation was noted.   Left ventricular function is normal. Nuclear stress EF: 69 %. The left ventricular ejection fraction is hyperdynamic (>65%). End diastolic cavity size is normal.   Prior study not available for comparison.   No ischemia or infarction on  perfusion images. Normal wall motion.    ASSESSMENT AND PLAN:  1.  Coronary artery disease with very high calcium score indicating higher CV risk. No significant angina and negative Myoview study in Oct 2022. He needs aggressive risk factor modification. Continue  ASA 81 mg daily. He is on high dose statin but LDL is not at goal. Also BP is not controlled. Recommend regular aerobic exercise, weight control and whole food, plant based diet/ Mediterranean diet.  2. HLD. LDL still elevated high dose Crestor 40 mg daily now. Last LDL 100. Goal would ideally be less than 55. Will repeat fasting labs. If not at goal will refer to lipid clinic to consider PCSK 9 inhibitor.  3. HTN. BP is not at goal <130/80. Continue losartan. Will add Toprol XL 50 mg daily.    Current medicines are reviewed at length with the patient today.  The patient does not have concerns regarding medicines.  The following changes have been made:  Crestor 40 mg daily  Labs/ tests ordered today include:   Orders Placed This Encounter  Procedures   EKG 12-Lead     Disposition:   FU 6 months   Signed, Farran Amsden Martin[place], MD  03/12/2023 8:47 AM    [facility] [address] [place], [place],  [address] Phone [phone], Fax [phone]

## 2023-03-12 ENCOUNTER — Ambulatory Visit: Payer: BC Managed Care – PPO | Attending: Cardiology | Admitting: Cardiology

## 2023-03-12 ENCOUNTER — Encounter: Payer: Self-pay | Admitting: Cardiology

## 2023-03-12 VITALS — BP 144/84 | HR 105 | Ht 72.0 in | Wt 224.4 lb

## 2023-03-12 DIAGNOSIS — E782 Mixed hyperlipidemia: Secondary | ICD-10-CM | POA: Diagnosis not present

## 2023-03-12 DIAGNOSIS — I2510 Atherosclerotic heart disease of native coronary artery without angina pectoris: Secondary | ICD-10-CM | POA: Insufficient documentation

## 2023-03-12 DIAGNOSIS — I10 Essential (primary) hypertension: Secondary | ICD-10-CM

## 2023-03-12 MED ORDER — METOPROLOL SUCCINATE ER 50 MG PO TB24: 50.0000 mg | ORAL_TABLET | Freq: Every day | ORAL | 3 refills | Status: DC

## 2023-03-12 MED ORDER — ASPIRIN 81 MG PO TBEC: 81.0000 mg | DELAYED_RELEASE_TABLET | Freq: Every day | ORAL | 3 refills | Status: AC

## 2023-03-12 NOTE — Patient Instructions (Signed)
Medication Instructions:  Start Toprol Xl 50 mg daily Continue all other medications *If you need a refill on your cardiac medications before your next appointment, please call your pharmacy*   Lab Work: Fasting bmet,lipid and hepatic panels  Lab orders enclosed   Testing/Procedures: None ordered   Follow-Up: At [facility], you and your health needs are our priority.  As part of our continuing mission to provide you with exceptional heart care, we have created designated Provider Care Teams.  These Care Teams include your primary Cardiologist (physician) and Advanced Practice Providers (APPs -  Physician Assistants and Nurse Practitioners) who all work together to provide you with the care you need, when you need it.  We recommend signing up for the patient portal called "MyChart".  Sign up information is provided on this After Visit Summary.  MyChart is used to connect with patients for Virtual Visits (Telemedicine).  Patients are able to view lab/test results, encounter notes, upcoming appointments, etc.  Non-urgent messages can be sent to your provider as well.   To learn more about what you can do with MyChart, go to [email or web address].    Your next appointment:  6 months    Provider: Dr.Jordan

## 2023-03-27 DIAGNOSIS — D229 Melanocytic nevi, unspecified: Secondary | ICD-10-CM | POA: Diagnosis not present

## 2023-03-27 DIAGNOSIS — L814 Other melanin hyperpigmentation: Secondary | ICD-10-CM | POA: Diagnosis not present

## 2023-03-27 DIAGNOSIS — L738 Other specified follicular disorders: Secondary | ICD-10-CM | POA: Diagnosis not present

## 2023-03-27 DIAGNOSIS — L820 Inflamed seborrheic keratosis: Secondary | ICD-10-CM | POA: Diagnosis not present

## 2023-03-27 DIAGNOSIS — L821 Other seborrheic keratosis: Secondary | ICD-10-CM | POA: Diagnosis not present

## 2023-06-08 IMAGING — DX DG KNEE 1-2V PORT*L*
2 series · 2 of 2 positions shown · non-contrast
Comparison: None Available.

CLINICAL DATA: Status post left total knee arthroplasty

EXAM:
PORTABLE LEFT KNEE - 1-2 VIEW

[knee ap]
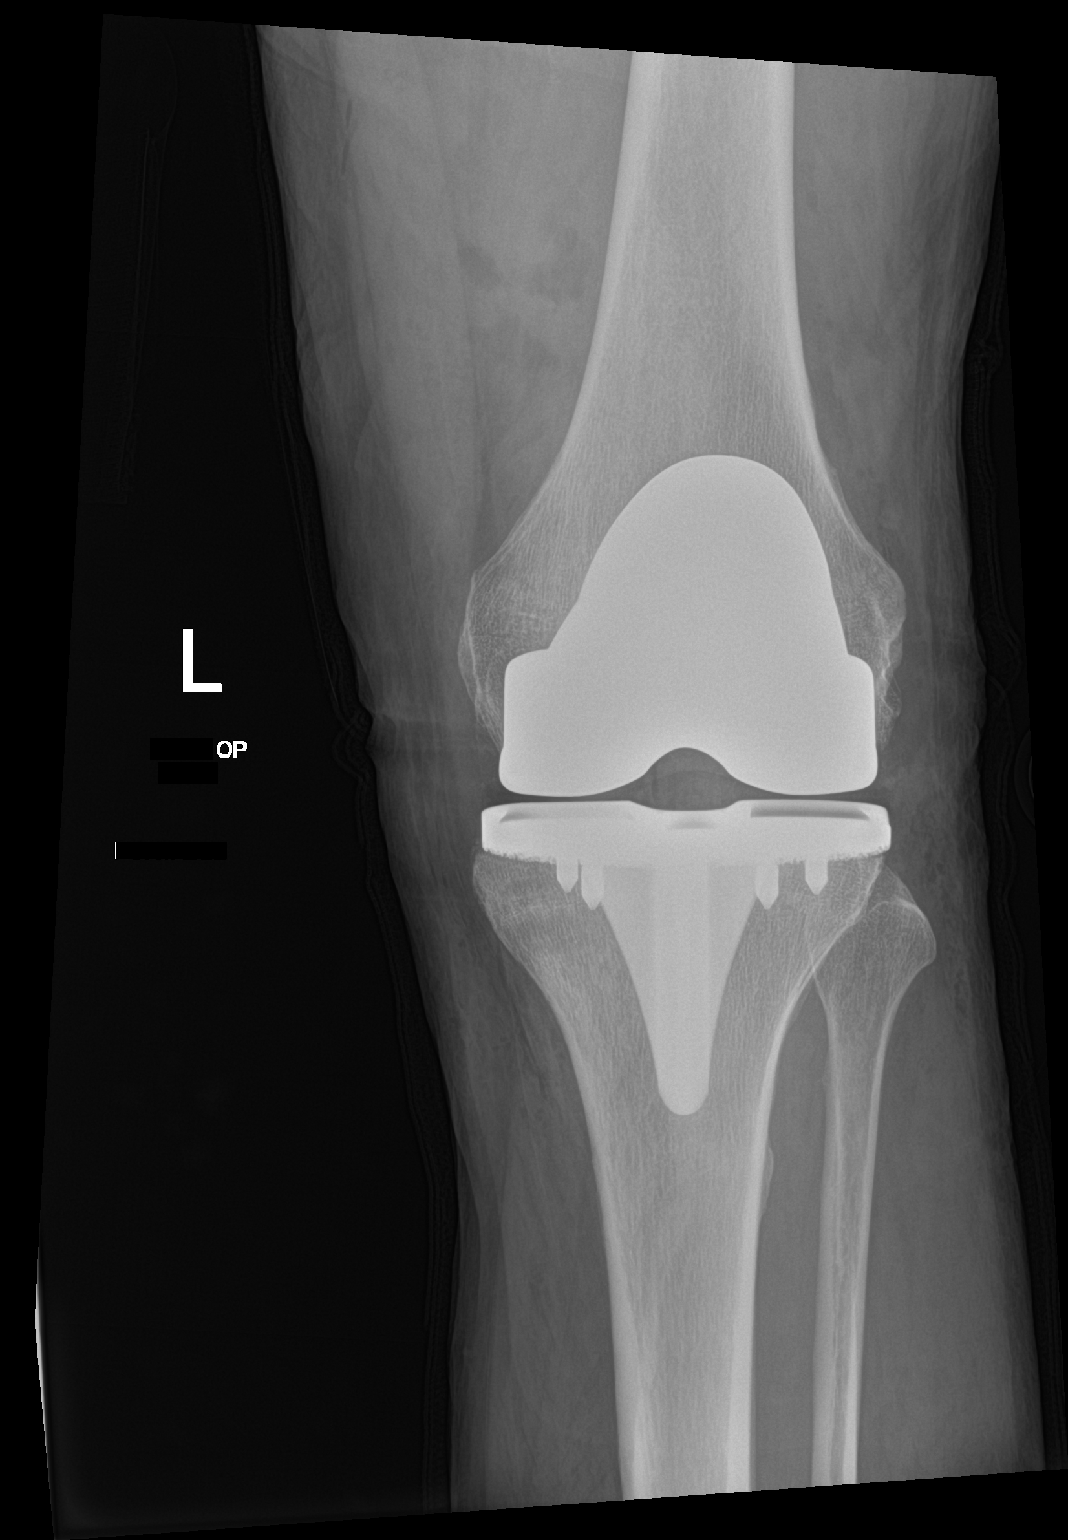

[knee lat]
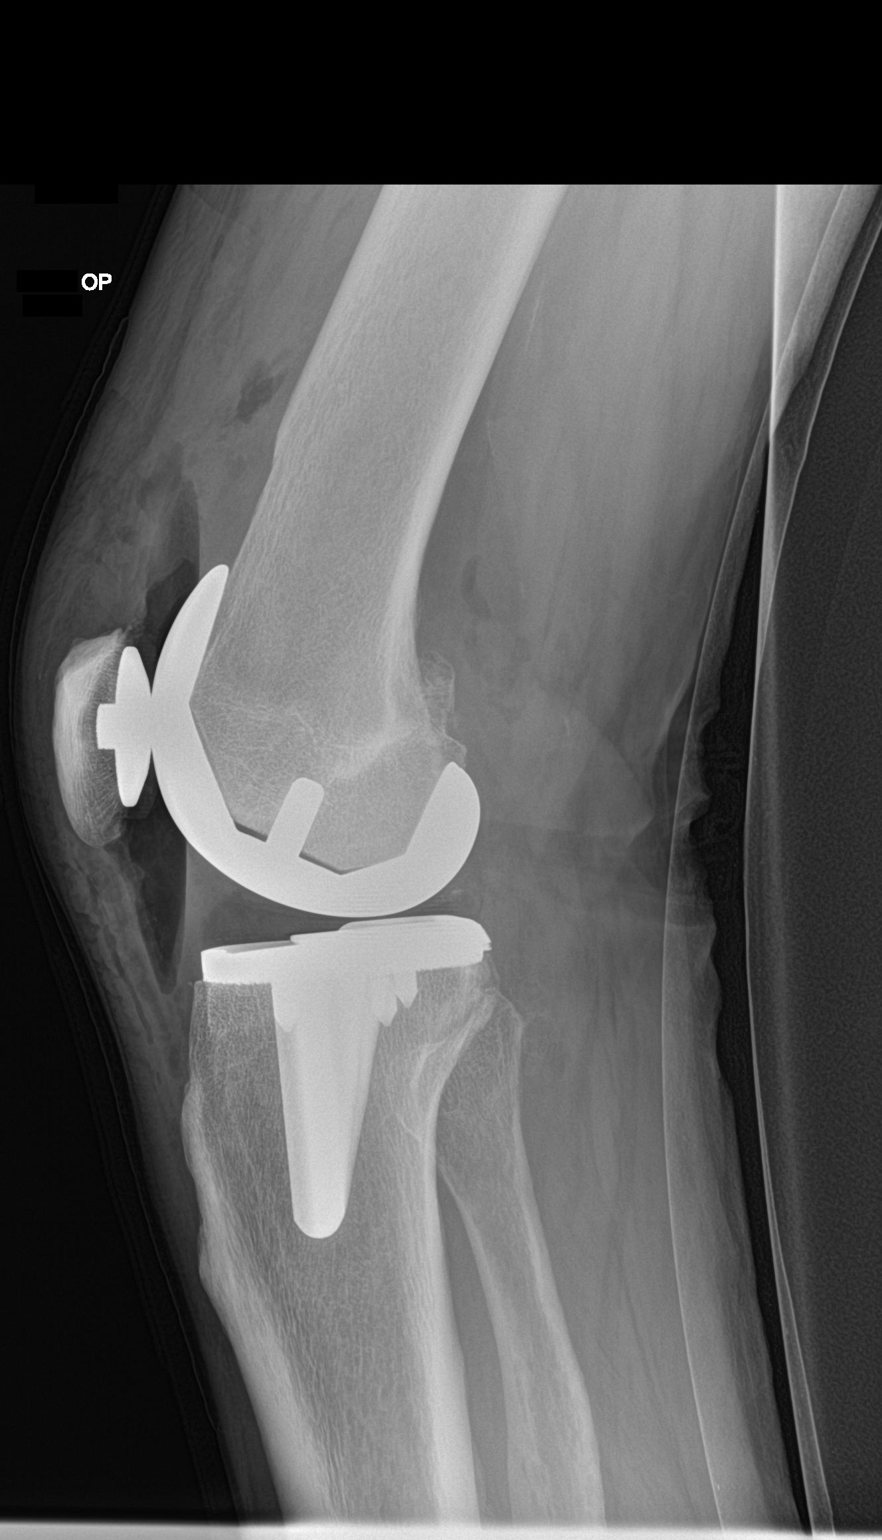

[2 of 2 positions shown; findings below may reference images not displayed]

FINDINGS: Status post left total knee arthroplasty with well-positioned intact
hardware with no evidence of hardware loosening. No dislocation. No
bone fractures. No focal osseous lesions. Expected gas within and
surrounding the left knee joint.
IMPRESSION: Satisfactory immediate postoperative appearance status post left
total knee arthroplasty.

## 2023-07-15 DIAGNOSIS — Z13220 Encounter for screening for lipoid disorders: Secondary | ICD-10-CM | POA: Diagnosis not present

## 2023-07-15 DIAGNOSIS — Z125 Encounter for screening for malignant neoplasm of prostate: Secondary | ICD-10-CM | POA: Diagnosis not present

## 2023-07-15 DIAGNOSIS — Z0000 Encounter for general adult medical examination without abnormal findings: Secondary | ICD-10-CM | POA: Diagnosis not present

## 2023-07-15 LAB — LAB REPORT - SCANNED
EGFR: 90
HM Hepatitis Screen: NEGATIVE

## 2023-07-17 DIAGNOSIS — I10 Essential (primary) hypertension: Secondary | ICD-10-CM | POA: Diagnosis not present

## 2023-07-17 DIAGNOSIS — Z0000 Encounter for general adult medical examination without abnormal findings: Secondary | ICD-10-CM | POA: Diagnosis not present

## 2023-07-17 DIAGNOSIS — R972 Elevated prostate specific antigen [PSA]: Secondary | ICD-10-CM | POA: Diagnosis not present

## 2023-07-17 DIAGNOSIS — M67471 Ganglion, right ankle and foot: Secondary | ICD-10-CM | POA: Diagnosis not present

## 2023-07-17 DIAGNOSIS — E7800 Pure hypercholesterolemia, unspecified: Secondary | ICD-10-CM | POA: Diagnosis not present

## 2023-09-02 NOTE — Progress Notes (Deleted)
Cardiology Office Note   Date:  09/02/2023   ID:  Kenneth Boone, DOB 28-Nov-1961, MRN [id number]  PCP:  Kenneth Floro, MD  Cardiologist:   Kenneth Calvillo [place], MD   No chief complaint on file.     History of Present Illness: Kenneth Boone is a 62 y.o. male who is seen for follow up of CAD with abnormal coronary calcium score. He has a history of HTN and HLD. Myoview study in October 2022 was normal.  States he has been on statin for 25 years. When seen by Dr Tenny Craw coronary calcium score ordered to assess cardiac risk. Found to have severe 3 vessel calcification with score of 1212 placing him at 98th percentile. He denies any chest pain or  palpitations. He did have knee surgery this past year and admits he hasn't been exercising as much as he should. He did spend the last 2 days cutting up and loading trees without difficulty. Did get SOB walking up 4 flights of stairs today. Notes BP at home typically 140/84 with some readings up to 150-160.     Past Medical History:  Diagnosis Date   Cancer ([facility])    melanoma to the nose   Hyperlipidemia    Hypertension    Osteoarthritis     Past Surgical History:  Procedure Laterality Date   KNEE ARTHROPLASTY Left 05/22/2022   Procedure: COMPUTER ASSISTED TOTAL KNEE ARTHROPLASTY;  Surgeon: Kenneth Frederic, MD;  Location: [facility];  Service: Orthopedics;  Laterality: Left;  150   NO PAST SURGERIES     WISDOM TOOTH EXTRACTION       Current Outpatient Medications  Medication Sig Dispense Refill   Ascorbic Acid (VITAMIN C) 1000 MG tablet Take 2,000 mg by mouth daily.     aspirin EC 81 MG tablet Take 1 tablet (81 mg total) by mouth daily. Swallow whole. 90 tablet 3   carboxymethylcellulose (REFRESH PLUS) 0.5 % SOLN Place 1 drop into both eyes 3 (three) times daily as needed (dry/irritated eyes).     losartan (COZAAR) 100 MG tablet Take 100 mg by mouth daily.     meloxicam (MOBIC) 15 MG tablet Take 1 tablet (15 mg total) by mouth daily.  30 tablet 3   metoprolol succinate (TOPROL XL) 50 MG 24 hr tablet Take 1 tablet (50 mg total) by mouth daily. Take with or immediately following a meal. 90 tablet 3   PRESCRIPTION MEDICATION Apply 1 application. topically daily. Curasol     rosuvastatin (CRESTOR) 40 MG tablet Take 40 mg by mouth daily.     No current facility-administered medications for this visit.    Allergies:   Patient has no known allergies.    Social History:  The patient  reports that he has never smoked. He has never been exposed to tobacco smoke. He has never used smokeless tobacco. He reports current alcohol use. He reports that he does not use drugs.   Family History:  The patient's family history includes Diabetes Mellitus II in his brother, mother, and sister; Heart attack (age of onset: 55) in his father; Stroke in his father.    ROS:  Please see the history of present illness.   Otherwise, review of systems are positive for none.   All other systems are reviewed and negative.    PHYSICAL EXAM: VS:  There were no vitals taken for this visit. , BMI There is no height or weight on file to calculate BMI. GEN: Well nourished, well developed,  in no acute distress HEENT: normal Neck: no JVD, carotid bruits, or masses Cardiac: RRR;soft 1-2/6 systolic murmur RUSB, no  rubs, or gallops,no edema  Respiratory:  clear to auscultation bilaterally, normal work of breathing GI: soft, nontender, nondistended, + BS MS: no deformity or atrophy Skin: warm and dry, no rash Neuro:  Strength and sensation are intact Psych: euthymic mood, full affect   EKG:  EKG is ordered today. The ekg ordered today demonstrates NSR rate 105. Normal. I have personally reviewed and interpreted this study.    Recent Labs: No results found for requested labs within last 365 days.   Dated 06/13/21: cholesterol 262, triglycerides 366. HDL 41, LDL 153. CMET and PSA normal. Dated 07/03/22: cholesterol 188, triglycerides 225, HDL 50, LDL  100. CMET normal.   Lipid Panel No results found for: "CHOL", "TRIG", "HDL", "CHOLHDL", "VLDL", "LDLCALC", "LDLDIRECT"    Wt Readings from Last 3 Encounters:  03/12/23 224 lb 6.4 oz (101.8 kg)  05/14/22 210 lb (95.3 kg)  09/27/21 217 lb (98.4 kg)      Other studies Reviewed: Additional studies/ records that were reviewed today include:   CLINICAL DATA:  62 year old Caucasian male with history of hyperlipidemia, hypertension and family history of heart disease.   EXAM: CT CARDIAC CORONARY ARTERY CALCIUM SCORE   TECHNIQUE: Non-contrast imaging through the heart was performed using prospective ECG gating. Image post processing was performed on an independent workstation, allowing for quantitative analysis of the heart and coronary arteries. Note that this exam targets the heart and the chest was not imaged in its entirety.   COMPARISON:  None.   FINDINGS: CORONARY CALCIUM SCORES:   Left Main: 0   LAD: 959   LCx: 7.5   RCA: 245   Total Agatston Score: 1,212   MESA database percentile: 98   AORTA MEASUREMENTS:   Ascending Aorta: 34 mm   Descending Aorta: 24 mm   OTHER FINDINGS:   The heart size is within normal limits. No pericardial fluid is identified. Visualized segments of the thoracic aorta and central pulmonary arteries are normal in caliber. Visualized mediastinum and hilar regions demonstrate no lymphadenopathy or masses. Visualized lungs show no evidence of pulmonary edema, consolidation, pneumothorax, nodule or pleural fluid. Visualized upper abdomen and bony structures are unremarkable.   IMPRESSION: Coronary calcium score of 1,212 is at the 98th percentile for the patient's age, sex and race.     Electronically Signed   By: Irish Lack M.D.   On: 09/11/2021 11:29   Myoview 09/27/21: Study Highlights      The study is normal. Findings are consistent with no prior ischemia and no prior myocardial infarction. The study is low risk.    Exercise capacity was normal. Patient exercised for 8 min and 11 sec. Maximum HR of 148 bpm. MPHR 91.0 %. Peak METS 9.0 . The patient reached the end of the protocol and achieved the target heart rate. The patient reported fatigue during the stress test. Normal blood pressure and normal heart rate response noted during stress.   No ST deviation was noted.   Left ventricular function is normal. Nuclear stress EF: 69 %. The left ventricular ejection fraction is hyperdynamic (>65%). End diastolic cavity size is normal.   Prior study not available for comparison.   No ischemia or infarction on perfusion images. Normal wall motion.    ASSESSMENT AND PLAN:  1.  Coronary artery disease with very high calcium score indicating higher CV risk. No significant angina and negative  Myoview study in Oct 2022. He needs aggressive risk factor modification. Continue  ASA 81 mg daily. He is on high dose statin but LDL is not at goal. Also BP is not controlled. Recommend regular aerobic exercise, weight control and whole food, plant based diet/ Mediterranean diet.  2. HLD. LDL still elevated high dose Crestor 40 mg daily now. Last LDL 100. Goal would ideally be less than 55. Will repeat fasting labs. If not at goal will refer to lipid clinic to consider PCSK 9 inhibitor.  3. HTN. BP is not at goal <130/80. Continue losartan. Will add Toprol XL 50 mg daily.    Current medicines are reviewed at length with the patient today.  The patient does not have concerns regarding medicines.  The following changes have been made:  Crestor 40 mg daily  Labs/ tests ordered today include:   No orders of the defined types were placed in this encounter.    Disposition:   FU 6 months   Signed, Kaceton Vieau [place], MD  09/02/2023 12:59 PM    [facility] [address], [place], [place], [address] Phone [phone], Fax [phone]

## 2023-09-08 ENCOUNTER — Ambulatory Visit: Payer: BC Managed Care – PPO | Admitting: Cardiology

## 2023-10-09 NOTE — Progress Notes (Unsigned)
Cardiology Office Note   Date:  10/21/2023   ID:  Kenneth Boone, DOB 04-Jun-1961, MRN [id number]  PCP:  Daisy Floro, MD  Cardiologist:   Gerardo Caiazzo [place], MD   Chief Complaint  Patient presents with   Dizziness    Pt stated that he gets dizzy feels dehydration and when he bends over and raises up rapidly    Coronary Artery Disease      History of Present Illness: Kenneth Boone is a 62 y.o. male who is seen for follow up of CAD with abnormal coronary calcium score. He has a history of HTN and HLD. Myoview study in October 2022 was normal.  States he has been on statin for 25 years. When seen by Dr Tenny Craw coronary calcium score ordered to assess cardiac risk. Found to have severe 3 vessel calcification with score of 1212 placing him at 98th percentile.   On follow up today he denies any chest pain or SOB. Has been doing some heavy yard work with raking and mowing. Notes BP at home average is 135/78. Did not like side effects of Toprol. Now just on losartan for BP. Enjoys drinking wine. Has lost 6 lbs this year.     Past Medical History:  Diagnosis Date   Cancer (HCC)    melanoma to the nose   Hyperlipidemia    Hypertension    Osteoarthritis     Past Surgical History:  Procedure Laterality Date   KNEE ARTHROPLASTY Left 05/22/2022   Procedure: COMPUTER ASSISTED TOTAL KNEE ARTHROPLASTY;  Surgeon: Samson Frederic, MD;  Location: [facility];  Service: Orthopedics;  Laterality: Left;  150   NO PAST SURGERIES     WISDOM TOOTH EXTRACTION       Current Outpatient Medications  Medication Sig Dispense Refill   Ascorbic Acid (VITAMIN C) 1000 MG tablet Take 2,000 mg by mouth daily.     aspirin EC 81 MG tablet Take 1 tablet (81 mg total) by mouth daily. Swallow whole. 90 tablet 3   losartan (COZAAR) 100 MG tablet Take 100 mg by mouth daily.     PRESCRIPTION MEDICATION Apply 1 application. topically daily. Curasol     rosuvastatin (CRESTOR) 40 MG tablet Take 40 mg by mouth  daily.     No current facility-administered medications for this visit.    Allergies:   Patient has no known allergies.    Social History:  The patient  reports that he has never smoked. He has never been exposed to tobacco smoke. He has never used smokeless tobacco. He reports current alcohol use. He reports that he does not use drugs.   Family History:  The patient's family history includes Diabetes Mellitus II in his brother, mother, and sister; Heart attack (age of onset: 6) in his father; Stroke in his father.    ROS:  Please see the history of present illness.   Otherwise, review of systems are positive for none.   All other systems are reviewed and negative.    PHYSICAL EXAM: VS:  BP (!) 140/70 (BP Location: Left Arm, Patient Position: Sitting, Cuff Size: Normal)   Pulse 93   Ht 6' (1.829 m)   Wt 219 lb (99.3 kg)   SpO2 97%   BMI 29.70 kg/m  , BMI Body mass index is 29.7 kg/m. GEN: Well nourished, well developed, in no acute distress HEENT: normal Neck: no JVD, carotid bruits, or masses Cardiac: RRR;soft 1/6 systolic murmur RUSB, no  rubs, or gallops,no edema  Respiratory:  clear to auscultation bilaterally, normal work of breathing GI: soft, nontender, nondistended, + BS MS: no deformity or atrophy Skin: warm and dry, no rash Neuro:  Strength and sensation are intact Psych: euthymic mood, full affect   EKG:  EKG is not ordered today.     Recent Labs: No results found for requested labs within last 365 days.   Dated 06/13/21: cholesterol 262, triglycerides 366. HDL 41, LDL 153. CMET and PSA normal. Dated 07/03/22: cholesterol 188, triglycerides 225, HDL 50, LDL 100. CMET normal.  Dated 07/15/23: cholesterol 191, triglycerides 255, HDL 48, LDL 100, CMET normal   Lipid Panel No results found for: "CHOL", "TRIG", "HDL", "CHOLHDL", "VLDL", "LDLCALC", "LDLDIRECT"    Wt Readings from Last 3 Encounters:  10/21/23 219 lb (99.3 kg)  03/12/23 224 lb 6.4 oz (101.8  kg)  05/14/22 210 lb (95.3 kg)      Other studies Reviewed: Additional studies/ records that were reviewed today include:   CLINICAL DATA:  62 year old Caucasian male with history of hyperlipidemia, hypertension and family history of heart disease.   EXAM: CT CARDIAC CORONARY ARTERY CALCIUM SCORE   TECHNIQUE: Non-contrast imaging through the heart was performed using prospective ECG gating. Image post processing was performed on an independent workstation, allowing for quantitative analysis of the heart and coronary arteries. Note that this exam targets the heart and the chest was not imaged in its entirety.   COMPARISON:  None.   FINDINGS: CORONARY CALCIUM SCORES:   Left Main: 0   LAD: 959   LCx: 7.5   RCA: 245   Total Agatston Score: 1,212   MESA database percentile: 98   AORTA MEASUREMENTS:   Ascending Aorta: 34 mm   Descending Aorta: 24 mm   OTHER FINDINGS:   The heart size is within normal limits. No pericardial fluid is identified. Visualized segments of the thoracic aorta and central pulmonary arteries are normal in caliber. Visualized mediastinum and hilar regions demonstrate no lymphadenopathy or masses. Visualized lungs show no evidence of pulmonary edema, consolidation, pneumothorax, nodule or pleural fluid. Visualized upper abdomen and bony structures are unremarkable.   IMPRESSION: Coronary calcium score of 1,212 is at the 98th percentile for the patient's age, sex and race.     Electronically Signed   By: Irish Lack M.D.   On: 09/11/2021 11:29   Myoview 09/27/21: Study Highlights      The study is normal. Findings are consistent with no prior ischemia and no prior myocardial infarction. The study is low risk.   Exercise capacity was normal. Patient exercised for 8 min and 11 sec. Maximum HR of 148 bpm. MPHR 91.0 %. Peak METS 9.0 . The patient reached the end of the protocol and achieved the target heart rate. The patient reported  fatigue during the stress test. Normal blood pressure and normal heart rate response noted during stress.   No ST deviation was noted.   Left ventricular function is normal. Nuclear stress EF: 69 %. The left ventricular ejection fraction is hyperdynamic (>65%). End diastolic cavity size is normal.   Prior study not available for comparison.   No ischemia or infarction on perfusion images. Normal wall motion.    ASSESSMENT AND PLAN:  1.  Coronary artery disease with very high calcium score indicating higher CV risk. No significant angina and negative Myoview study in Oct 2022. He needs aggressive risk factor modification. Continue  ASA 81 mg daily. He is on high dose statin but LDL is not at  goal. Also BP is not ideally controlled. Recommend regular aerobic exercise, weight control and whole food, plant based diet/ Mediterranean diet. Should moderate wine intake.  2. HLD. LDL still elevated high dose Crestor 40 mg daily now. Last LDL 100. Goal would ideally be less than 55. Discussed additional therapy including Zetia or PCSK 9 inhibitor but he is not interested in taking additional medication 3. HTN. BP is not at goal <130/80. Continue losartan. Again he does not want to take additional medication. Reports he did not tolerate Toprol XL. Focus on lifestyle changes.    Current medicines are reviewed at length with the patient today.  The patient does not have concerns regarding medicines.    No orders of the defined types were placed in this encounter.    Disposition:   FU one year   Signed, Hayk Divis [place], MD  10/21/2023 10:26 AM    [facility] [address] [place], [place], [address] Phone [phone], Fax [phone]

## 2023-10-21 ENCOUNTER — Encounter: Payer: Self-pay | Admitting: Cardiology

## 2023-10-21 ENCOUNTER — Ambulatory Visit: Payer: BC Managed Care – PPO | Attending: Cardiology | Admitting: Cardiology

## 2023-10-21 VITALS — BP 140/70 | HR 93 | Ht 72.0 in | Wt 219.0 lb

## 2023-10-21 DIAGNOSIS — I10 Essential (primary) hypertension: Secondary | ICD-10-CM | POA: Diagnosis not present

## 2023-10-21 DIAGNOSIS — I2510 Atherosclerotic heart disease of native coronary artery without angina pectoris: Secondary | ICD-10-CM | POA: Diagnosis not present

## 2023-10-21 DIAGNOSIS — E782 Mixed hyperlipidemia: Secondary | ICD-10-CM | POA: Diagnosis not present

## 2023-10-21 NOTE — Patient Instructions (Signed)
Medication Instructions:  No changes continue taking medication  *If you need a refill on your cardiac medications before your next appointment, please call your pharmacy*   Lab Work: None ordered  If you have labs (blood work) drawn today and your tests are completely normal, you will receive your results only by: MyChart Message (if you have MyChart) OR A paper copy in the mail If you have any lab test that is abnormal or we need to change your treatment, we will call you to review the results.   Testing/Procedures: None    Follow-Up: At [facility], you and your health needs are our priority.  As part of our continuing mission to provide you with exceptional heart care, we have created designated Provider Care Teams.  These Care Teams include your primary Cardiologist (physician) and Advanced Practice Providers (APPs -  Physician Assistants and Nurse Practitioners) who all work together to provide you with the care you need, when you need it.    Your next appointment:   1 year(s)  Provider:   Peter [place], MD

## 2023-10-28 DIAGNOSIS — R972 Elevated prostate specific antigen [PSA]: Secondary | ICD-10-CM | POA: Diagnosis not present

## 2023-12-19 DIAGNOSIS — R972 Elevated prostate specific antigen [PSA]: Secondary | ICD-10-CM | POA: Diagnosis not present

## 2024-01-01 ENCOUNTER — Other Ambulatory Visit: Payer: Self-pay | Admitting: Urology

## 2024-01-01 DIAGNOSIS — D49512 Neoplasm of unspecified behavior of left kidney: Secondary | ICD-10-CM

## 2024-01-02 ENCOUNTER — Other Ambulatory Visit: Payer: Self-pay | Admitting: Urology

## 2024-01-02 DIAGNOSIS — R972 Elevated prostate specific antigen [PSA]: Secondary | ICD-10-CM

## 2024-02-11 ENCOUNTER — Ambulatory Visit
Admission: RE | Admit: 2024-02-11 | Discharge: 2024-02-11 | Disposition: A | Discharge status: Home / Self Care | Payer: BC Managed Care – PPO | Source: Ambulatory Visit | Attending: Urology | Admitting: Urology

## 2024-02-11 DIAGNOSIS — R972 Elevated prostate specific antigen [PSA]: Secondary | ICD-10-CM

## 2024-02-11 MED ORDER — GADOPICLENOL 0.5 MMOL/ML IV SOLN
10.0000 mL | Freq: Once | INTRAVENOUS | Status: AC | PRN
  Administered 2024-02-11: 9 mL via INTRAVENOUS

## 2024-02-27 ENCOUNTER — Telehealth: Payer: Self-pay | Admitting: *Deleted

## 2024-02-27 ENCOUNTER — Telehealth: Payer: Self-pay

## 2024-02-27 NOTE — Telephone Encounter (Signed)
   Pre-operative Risk Assessment    Patient Name: Kenneth Boone  DOB: [birth year] MRN: [id number]   Date of last office visit: 10/21/23 DR. PETER [place] Date of next office visit: NONE   Request for Surgical Clearance    Procedure:   PROSTATE BIOPSY  Date of Surgery:  Clearance TBD                                Surgeon:  DR Mena Goes Surgeon's Group or Practice Name:  [facility] Phone number:  [phone] Fax number:  [phone]   Type of Clearance Requested:   - Medical  - Pharmacy:  Hold Aspirin     Type of Anesthesia:  Not Indicated   Additional requests/questions:    SignedMarlow Baars   02/27/2024, 3:13 PM

## 2024-02-27 NOTE — Telephone Encounter (Signed)
   Name: Kenneth Boone  DOB: [birth year]  MRN: [id number]  Primary Cardiologist: Peter [place], MD   Preoperative team, please contact this patient and set up a phone call appointment for further preoperative risk assessment. Please obtain consent and complete medication review. Thank you for your help.  I confirm that guidance regarding antiplatelet and oral anticoagulation therapy has been completed and, if necessary, noted below.  Regarding ASA therapy, we recommend continuation of ASA throughout the perioperative period.  However, if the surgeon feels that cessation of ASA is required in the perioperative period, it may be stopped 5-7 days prior to surgery with a plan to resume it as soon as felt to be feasible from a surgical standpoint in the post-operative period.  I also confirmed the patient resides in the state of [place]. As per [facility] Medical Board telemedicine laws, the patient must reside in the state in which the provider is licensed.   Joylene Grapes, NP 02/27/2024, 3:19 PM [facility]

## 2024-02-27 NOTE — Telephone Encounter (Signed)
 Pt scheduled tele preop appt 03/03/24. Pt states surgeon office is waiting for preop clearance from cardiology. Once they have clearance they will be able to schedule.   Med rec and consent are done.      Patient Consent for Virtual Visit        TEJUAN GHOLSON has provided verbal consent on 02/27/2024 for a virtual visit (video or telephone).   CONSENT FOR VIRTUAL VISIT FOR:  Charlean Merl  By participating in this virtual visit I agree to the following:  I hereby voluntarily request, consent and authorize [facility] and its employed or contracted physicians, physician assistants, nurse practitioners or other licensed health care professionals (the Practitioner), to provide me with telemedicine health care services (the "Services") as deemed necessary by the treating Practitioner. I acknowledge and consent to receive the Services by the Practitioner via telemedicine. I understand that the telemedicine visit will involve communicating with the Practitioner through live audiovisual communication technology and the disclosure of certain medical information by electronic transmission. I acknowledge that I have been given the opportunity to request an in-person assessment or other available alternative prior to the telemedicine visit and am voluntarily participating in the telemedicine visit.  I understand that I have the right to withhold or withdraw my consent to the use of telemedicine in the course of my care at any time, without affecting my right to future care or treatment, and that the Practitioner or I may terminate the telemedicine visit at any time. I understand that I have the right to inspect all information obtained and/or recorded in the course of the telemedicine visit and may receive copies of available information for a reasonable fee.  I understand that some of the potential risks of receiving the Services via telemedicine include:  Delay or interruption in medical  evaluation due to technological equipment failure or disruption; Information transmitted may not be sufficient (e.g. poor resolution of images) to allow for appropriate medical decision making by the Practitioner; and/or  In rare instances, security protocols could fail, causing a breach of personal health information.  Furthermore, I acknowledge that it is my responsibility to provide information about my medical history, conditions and care that is complete and accurate to the best of my ability. I acknowledge that Practitioner's advice, recommendations, and/or decision may be based on factors not within their control, such as incomplete or inaccurate data provided by me or distortions of diagnostic images or specimens that may result from electronic transmissions. I understand that the practice of medicine is not an exact science and that Practitioner makes no warranties or guarantees regarding treatment outcomes. I acknowledge that a copy of this consent can be made available to me via my patient portal [facility] MyChart), or I can request a printed copy by calling the office of [facility].    I understand that my insurance will be billed for this visit.   I have read or had this consent read to me. I understand the contents of this consent, which adequately explains the benefits and risks of the Services being provided via telemedicine.  I have been provided ample opportunity to ask questions regarding this consent and the Services and have had my questions answered to my satisfaction. I give my informed consent for the services to be provided through the use of telemedicine in my medical care

## 2024-02-27 NOTE — Telephone Encounter (Signed)
 Pt scheduled tele preop appt 03/03/24. Pt states surgeon office is waiting for preop clearance from cardiology. Once they have clearance they will be able to schedule.   Med rec and consent are done.

## 2024-03-03 ENCOUNTER — Ambulatory Visit: Attending: Cardiovascular Disease

## 2024-03-03 DIAGNOSIS — Z01810 Encounter for preprocedural cardiovascular examination: Secondary | ICD-10-CM

## 2024-03-03 NOTE — Progress Notes (Signed)
 Virtual Visit via Telephone Note   Because of WAGNER TANZI co-morbid illnesses, he is at least at moderate risk for complications without adequate follow up.  This format is felt to be most appropriate for this patient at this time.  Due to technical limitations with video connection (technology), today's appointment will be conducted as an audio only telehealth visit, and KALEB SEK verbally agreed to proceed in this manner.   All issues noted in this document were discussed and addressed.  No physical exam could be performed with this format.  Evaluation Performed:  Preoperative cardiovascular risk assessment _____________   Date:  03/03/2024   Patient ID:  Kenneth Boone, DOB [birth year], MRN [id number] Patient Location:  Home Provider location:   Office  Primary Care Provider:  Daisy Floro, MD Primary Cardiologist:  Peter [place], MD  Chief Complaint / Patient Profile  63 y.o. y/o male with a h/o coronary artery disease with high calcium scoring, hyperlipidemia and hypertension.  Who is pending prostate biopsy with Dr. Mena Goes and presents today for telephonic preoperative cardiovascular risk assessment. History of Present Illness    Kenneth Boone is a 63 y.o. male who presents via audio/video conferencing for a telehealth visit today.  Pt was last seen in cardiology clinic on 10/21/23 by Dr. [place].  At that time HOLDYN POYSER was doing well.  The patient is now pending procedure as outlined above. Since his last visit, he has remained stable from a cardiac standpoint.   Today he denies chest pain, shortness of breath, lower extremity edema, fatigue, palpitations, melena, hematuria, hemoptysis, diaphoresis, weakness, presyncope, syncope, orthopnea, and PND.  Past Medical History    Past Medical History:  Diagnosis Date   Cancer (HCC)    melanoma to the nose   Hyperlipidemia    Hypertension    Osteoarthritis    Past Surgical History:   Procedure Laterality Date   KNEE ARTHROPLASTY Left 05/22/2022   Procedure: COMPUTER ASSISTED TOTAL KNEE ARTHROPLASTY;  Surgeon: Samson Frederic, MD;  Location: [facility];  Service: Orthopedics;  Laterality: Left;  150   NO PAST SURGERIES     WISDOM TOOTH EXTRACTION     Allergies No Known Allergies Home Medications    Prior to Admission medications   Medication Sig Start Date End Date Taking? Authorizing Provider  Ascorbic Acid (VITAMIN C) 1000 MG tablet Take 2,000 mg by mouth daily.    [provider]  aspirin EC 81 MG tablet Take 1 tablet (81 mg total) by mouth daily. Swallow whole. 03/12/23   [place], Peter M, MD  losartan (COZAAR) 100 MG tablet Take 100 mg by mouth daily. 07/30/21   [provider]  PRESCRIPTION MEDICATION Apply 1 application. topically daily. Curasol    [provider]  rosuvastatin (CRESTOR) 40 MG tablet Take 40 mg by mouth daily. 06/29/21   [provider]    Physical Exam    Vital Signs:  Charlean Merl does not have vital signs available for review today.  Given telephonic nature of communication, physical exam is limited. AAOx3. NAD. Normal affect.  Speech and respirations are unlabored.  Accessory Clinical Findings   None Assessment & Plan    1.  Preoperative Cardiovascular Risk Assessment: Prostate biopsy with Dr. Mena Goes Mr. Rademaker's perioperative risk of a major cardiac event is 0.4% according to the Revised Cardiac Risk Index (RCRI).  Therefore, he is at low risk for perioperative complications.  His functional capacity is excellent at 9.89 METs  according to the Duke Activity Status Index (DASI). Recommendations: According to ACC/AHA guidelines, no further cardiovascular testing needed.  The patient may proceed to surgery at acceptable risk.   Antiplatelet and/or Anticoagulation Recommendations: Regarding ASA therapy, we recommend continuation of ASA throughout the perioperative period. However, if the surgeon feels  that cessation of ASA is required in the perioperative period, it may be stopped 5-7 days prior to surgery with a plan to resume it as soon as felt to be feasible from a surgical standpoint in the post-operative period.   The patient was advised that if he develops new symptoms prior to surgery to contact our office to arrange for a follow-up visit, and he verbalized understanding.  A copy of this note will be routed to requesting surgeon.  Time:   Today, I have spent 9 minutes with the patient with telehealth technology discussing medical history, symptoms, and management plan.    Rip Harbour, NP  03/03/2024, 4:35 PM

## 2024-04-13 DIAGNOSIS — N4289 Other specified disorders of prostate: Secondary | ICD-10-CM | POA: Diagnosis not present

## 2024-04-13 DIAGNOSIS — D075 Carcinoma in situ of prostate: Secondary | ICD-10-CM | POA: Diagnosis not present

## 2024-04-13 DIAGNOSIS — C61 Malignant neoplasm of prostate: Secondary | ICD-10-CM | POA: Diagnosis not present

## 2024-04-21 DIAGNOSIS — N400 Enlarged prostate without lower urinary tract symptoms: Secondary | ICD-10-CM | POA: Diagnosis not present

## 2024-04-21 DIAGNOSIS — C61 Malignant neoplasm of prostate: Secondary | ICD-10-CM | POA: Diagnosis not present

## 2024-06-02 DIAGNOSIS — Z83511 Family history of glaucoma: Secondary | ICD-10-CM | POA: Diagnosis not present

## 2024-06-29 DIAGNOSIS — C61 Malignant neoplasm of prostate: Secondary | ICD-10-CM | POA: Diagnosis not present

## 2024-07-14 DIAGNOSIS — C61 Malignant neoplasm of prostate: Secondary | ICD-10-CM | POA: Diagnosis not present

## 2024-07-19 DIAGNOSIS — Z83511 Family history of glaucoma: Secondary | ICD-10-CM | POA: Diagnosis not present

## 2024-07-21 DIAGNOSIS — Z0000 Encounter for general adult medical examination without abnormal findings: Secondary | ICD-10-CM | POA: Diagnosis not present

## 2024-07-21 DIAGNOSIS — C61 Malignant neoplasm of prostate: Secondary | ICD-10-CM | POA: Diagnosis not present

## 2024-07-22 NOTE — Progress Notes (Incomplete)
 GU Location of Tumor / Histology:  Prostate Ca  If Prostate Cancer, Gleason Score is (3 + 3) and PSA is (5.38 )  Kenneth Boone presented as referral from Dr. Donnice Brooks [facility]) elevated PSA.  Biopsies       02/11/2024 Dr. Donnice Brooks MR Prostate with/without Contrast CLINICAL DATA:  Elevated PSA level. R97.20   IMPRESSION: 1. Small PI-RADS category 4 lesion of the right posterolateral peripheral zone at the apex. 2. Small PI-RADS category 4 lesion of the right anterior peripheral zone in the mid gland. 3. Small PI-RADS category 3 lesion of the right posterolateral peripheral zone in the mid gland. 4.  Targeting data sent to [facility]. 5. Prostatomegaly and benign prostatic hypertrophy. 6. 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    Past/Anticipated interventions by urology, if any:  Dr. Donnice Brooks   Past/Anticipated interventions by medical oncology, if any: NA  Weight changes, if any: {:18581}  IPSS: SHIM:  Bowel/Bladder complaints, if any: {:18581}   Nausea/Vomiting, if any: {:18581}  Pain issues, if any:  {:18581}  SAFETY ISSUES: Prior radiation? {:18581} Pacemaker/ICD? {:18581} Possible current pregnancy? Male Is the patient on methotrexate? No  Current Complaints / other details:

## 2024-07-23 DIAGNOSIS — E7800 Pure hypercholesterolemia, unspecified: Secondary | ICD-10-CM | POA: Diagnosis not present

## 2024-07-23 DIAGNOSIS — Z0000 Encounter for general adult medical examination without abnormal findings: Secondary | ICD-10-CM | POA: Diagnosis not present

## 2024-07-23 DIAGNOSIS — I10 Essential (primary) hypertension: Secondary | ICD-10-CM | POA: Diagnosis not present

## 2024-07-26 ENCOUNTER — Telehealth: Payer: Self-pay | Admitting: Radiation Oncology

## 2024-07-26 NOTE — Telephone Encounter (Signed)
 Called patient to return his voicemail. Patient requested to cancel his upcoming appointment. Patient stated he had spoken to Dr. Nieves and Dr. Renda and was not ready to discuss radiation treatment at this time. Patient will contact Dr. Renda to be re-referred back when ready.

## 2024-07-28 ENCOUNTER — Ambulatory Visit

## 2024-07-28 ENCOUNTER — Ambulatory Visit: Admitting: Radiation Oncology
